# Patient Record
Sex: Male | Born: 1995 | Race: White | Hispanic: No | Marital: Single | Smoking: Never smoker
Health system: Southern US, Community
[De-identification: ages and names within clinical notes are randomized; demographics above are authoritative.]

## PROBLEM LIST (undated history)

## (undated) DIAGNOSIS — F845 Asperger's syndrome: Secondary | ICD-10-CM

## (undated) DIAGNOSIS — F419 Anxiety disorder, unspecified: Secondary | ICD-10-CM

## (undated) DIAGNOSIS — F32A Depression, unspecified: Secondary | ICD-10-CM

## (undated) DIAGNOSIS — F909 Attention-deficit hyperactivity disorder, unspecified type: Secondary | ICD-10-CM

## (undated) DIAGNOSIS — S0083XA Contusion of other part of head, initial encounter: Secondary | ICD-10-CM

## (undated) HISTORY — PX: TONSILLECTOMY: SUR1361

## (undated) HISTORY — DX: Attention-deficit hyperactivity disorder, unspecified type: F90.9

## (undated) HISTORY — PX: WISDOM TOOTH EXTRACTION: SHX21

## (undated) HISTORY — DX: Depression, unspecified: F32.A

## (undated) HISTORY — DX: Asperger's syndrome: F84.5

## (undated) HISTORY — DX: Contusion of other part of head, initial encounter: S00.83XA

---
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
## 2016-03-14 ENCOUNTER — Ambulatory Visit (INDEPENDENT_AMBULATORY_CARE_PROVIDER_SITE_OTHER): Payer: BLUE CROSS/BLUE SHIELD | Admitting: Psychiatry

## 2016-03-14 ENCOUNTER — Encounter (HOSPITAL_COMMUNITY): Payer: Self-pay | Admitting: Psychiatry

## 2016-03-14 VITALS — BP 110/64 | HR 88 | Ht 65.0 in | Wt 133.4 lb

## 2016-03-14 DIAGNOSIS — F902 Attention-deficit hyperactivity disorder, combined type: Secondary | ICD-10-CM

## 2016-03-14 DIAGNOSIS — F845 Asperger's syndrome: Secondary | ICD-10-CM | POA: Diagnosis not present

## 2016-03-14 MED ORDER — DEXTROAMPHETAMINE SULFATE 5 MG PO TABS: 5.0000 mg | ORAL_TABLET | Freq: Two times a day (BID) | ORAL | Status: DC

## 2016-03-14 NOTE — Progress Notes (Signed)
[facility] Follow[facility] Outpatient Visit  Jeffrey Richardson 1996/08/20     Subjective: I'm doing well  History of present illness  Patient seen for medication follow-up. Mom came along with patient's permission. Mom was concerned about the diagnosis of fast Asperger syndrome and wondered when he had been diagnosed with that, going through the charts he had the diagnosis prior to coming to see me this was told to the mother who stated understanding.  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 Sleep is good but lately because of his coughing he is not sleeping well. Appetite has been good mood stable denies feeling hopeless helpless no anxiety no hallucinations or delusions denies suicidal or homicidal ideation. His tolerating his medications well and coping well.                                                                Notes from his initial visit with me on 10/19/15 Patient is a 20 year old African-American male who was seen along with his father with the patient's permission. Patient carries a previous diagnosis of ADHD combined type and Asperger's disorder. 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 Patient discussed rating discontinuing the VyVance 40 mg as he does not feel it helps.Patient states he has had bronchitis and pneumonia as a result of which he missed a lot of school  and had to drop to classes. States his doing better now. Dad reports that his insurance does not cover Vyvanse and so he is looking for an alternative. Discussed the rationale risks benefits options of Dextrostat with the patient and his father and obtained informed consent. Patient will take Dextrostat 5 mg by mouth every a.m. and at known. Discussed with the patient to give it a months try and see how it works he stated understanding.States his sleep is good appetite is good mood is stable denies feeling  hopeless helpless no anhedonia, no suicidal or homicidal ideation no hallucinations or delusions.   Substance abuse history none patient does not smoke cigarettes use alcohol marijuana or other drugs.  He is currently not dating anyone and has never been sexually active.       Active Ambulatory Problems    Diagnosis Date Noted  . ADHD (attention deficit hyperactivity disorder), combined type 06/02/2012  . Asperger syndrome 02/09/2015   Resolved Ambulatory Problems    Diagnosis Date Noted  . No Resolved Ambulatory Problems   Past Medical History  Diagnosis Date  . ADHD (attention deficit hyperactivity disorder)   . Asperger's disorder     Social History   Social History Narrative   patient is adopted   Current outpatient prescriptions:  .  dextroamphetamine (DEXTROSTAT) 5 MG tablet, Take 1 tablet (5 mg total) by mouth 2 (two) times daily with breakfast and lunch., Disp: 60 tablet, Rfl: 0 .  fluticasone (FLONASE) 50 MCG/ACT nasal spray, , Disp: , Rfl: 4 .  levocetirizine (XYZAL) 5 MG tablet, , Disp: , Rfl: 4 .  lisdexamfetamine (VYVANSE) 40 MG capsule, Take 1 capsule (40 mg total) by mouth every morning., Disp: 30 capsule, Rfl: 0 .  lisdexamfetamine (VYVANSE) 40 MG capsule, Take 1 capsule (40  mg total) by mouth every morning., Disp: 30 capsule, Rfl: 0 .  lisdexamfetamine (VYVANSE) 40 MG capsule, Take 1 capsule (40 mg total) by mouth every morning., Disp: 30 capsule, Rfl: 0 .  montelukast (SINGULAIR) 10 MG tablet, , Disp: , Rfl: 4   Review of Systems  Constitutional: Negative.  Negative for fever, weight loss and malaise/fatigue.  HENT: Negative.  Negative for congestion and sore throat.   Eyes: Negative.  Negative for blurred vision and redness.  Respiratory: Negative.  Negative for cough, shortness of breath and wheezing.   Cardiovascular: Negative.  Negative for chest pain and palpitations.  Gastrointestinal: Negative.  Negative for heartburn, nausea, vomiting, abdominal  pain, diarrhea and constipation.  Genitourinary: Negative.  Negative for dysuria.  Musculoskeletal: Negative.  Negative for myalgias and falls.  Skin: Negative.  Negative for rash.  Neurological: Negative.  Negative for dizziness, tremors, seizures, loss of consciousness, weakness and headaches.  Endo/Heme/Allergies: Negative.  Negative for environmental allergies.  Psychiatric/Behavioral: Negative.  Negative for depression, suicidal ideas, hallucinations, memory loss and substance abuse. The patient is not nervous/anxious and does not have insomnia.    General Appearance: alert, oriented, no acute distress and well nourished  Musculoskeletal: Strength & Muscle Tone: within normal limits Gait & Station: normal Patient leans: N/A  Mental Status Examination Appearance: Well groomed and casually dressed Alert: Yes Attention: good  Cooperative: Yes Eye Contact: Good Speech: Clear and coherent Psychomotor Activity: Normal Memory/Concentration: Intact/fair Oriented: person, place, time/date and situation Mood: Euthymic Affect: Appropriate Thought Processes and Associations: Linear Fund of Knowledge: Good Thought Content: Normal Insight: Good  Judgement:Good  Language: good suicidal ideation none  homicidal ideation none  Diagnosis:  ADHD, combined type , Asperger's disorder  Treatment plan: ADHD Continue Dextrostat 5 mg a.m. and noon.  Aspergers  disorder Symptoms will be monitored  Call when necessary Followup in 3  months ,Dr Lolly Mustache. Discussed provider was leaving the clinic and that he could see Dr. are seen deep stated understanding. 50% of this visit was spent in discussing organization,, scheduling things, impulse control techniques, social skills  interpersonal and supportive therapy was provided. Margit Banda, MD

## 2016-04-04 DIAGNOSIS — R05 Cough: Secondary | ICD-10-CM | POA: Diagnosis not present

## 2016-04-10 DIAGNOSIS — J309 Allergic rhinitis, unspecified: Secondary | ICD-10-CM | POA: Diagnosis not present

## 2016-04-10 DIAGNOSIS — R05 Cough: Secondary | ICD-10-CM | POA: Diagnosis not present

## 2016-04-17 DIAGNOSIS — J3089 Other allergic rhinitis: Secondary | ICD-10-CM | POA: Diagnosis not present

## 2016-04-17 DIAGNOSIS — J301 Allergic rhinitis due to pollen: Secondary | ICD-10-CM | POA: Diagnosis not present

## 2016-04-18 DIAGNOSIS — J3089 Other allergic rhinitis: Secondary | ICD-10-CM | POA: Diagnosis not present

## 2016-04-23 DIAGNOSIS — J301 Allergic rhinitis due to pollen: Secondary | ICD-10-CM | POA: Diagnosis not present

## 2016-04-23 DIAGNOSIS — J3089 Other allergic rhinitis: Secondary | ICD-10-CM | POA: Diagnosis not present

## 2016-04-23 DIAGNOSIS — J3081 Allergic rhinitis due to animal (cat) (dog) hair and dander: Secondary | ICD-10-CM | POA: Diagnosis not present

## 2016-04-26 DIAGNOSIS — J3081 Allergic rhinitis due to animal (cat) (dog) hair and dander: Secondary | ICD-10-CM | POA: Diagnosis not present

## 2016-04-26 DIAGNOSIS — J301 Allergic rhinitis due to pollen: Secondary | ICD-10-CM | POA: Diagnosis not present

## 2016-04-26 DIAGNOSIS — J3089 Other allergic rhinitis: Secondary | ICD-10-CM | POA: Diagnosis not present

## 2016-05-15 DIAGNOSIS — J3081 Allergic rhinitis due to animal (cat) (dog) hair and dander: Secondary | ICD-10-CM | POA: Diagnosis not present

## 2016-05-15 DIAGNOSIS — J3089 Other allergic rhinitis: Secondary | ICD-10-CM | POA: Diagnosis not present

## 2016-05-15 DIAGNOSIS — R05 Cough: Secondary | ICD-10-CM | POA: Diagnosis not present

## 2016-05-15 DIAGNOSIS — J301 Allergic rhinitis due to pollen: Secondary | ICD-10-CM | POA: Diagnosis not present

## 2016-05-16 ENCOUNTER — Ambulatory Visit (HOSPITAL_COMMUNITY): Payer: Self-pay | Admitting: Psychiatry

## 2016-05-17 DIAGNOSIS — J3089 Other allergic rhinitis: Secondary | ICD-10-CM | POA: Diagnosis not present

## 2016-05-17 DIAGNOSIS — J301 Allergic rhinitis due to pollen: Secondary | ICD-10-CM | POA: Diagnosis not present

## 2016-05-17 DIAGNOSIS — J3081 Allergic rhinitis due to animal (cat) (dog) hair and dander: Secondary | ICD-10-CM | POA: Diagnosis not present

## 2016-05-21 ENCOUNTER — Encounter (HOSPITAL_COMMUNITY): Payer: Self-pay | Admitting: Psychiatry

## 2016-05-21 ENCOUNTER — Ambulatory Visit (INDEPENDENT_AMBULATORY_CARE_PROVIDER_SITE_OTHER): Payer: BLUE CROSS/BLUE SHIELD | Admitting: Psychiatry

## 2016-05-21 VITALS — BP 110/70 | HR 74 | Ht 65.0 in | Wt 126.8 lb

## 2016-05-21 DIAGNOSIS — F902 Attention-deficit hyperactivity disorder, combined type: Secondary | ICD-10-CM | POA: Diagnosis not present

## 2016-05-21 MED ORDER — DEXTROAMPHETAMINE SULFATE 5 MG PO TABS: 5.0000 mg | ORAL_TABLET | Freq: Two times a day (BID) | ORAL | Status: DC

## 2016-05-21 MED ORDER — DEXTROAMPHETAMINE SULFATE 5 MG PO TABS: 5.0000 mg | ORAL_TABLET | Freq: Two times a day (BID) | ORAL | Status: AC

## 2016-05-21 NOTE — Progress Notes (Signed)
[facility] [address] Progress Note  Jeffrey Richardson [id number] 20 y.o.  05/21/2016 1:50 PM  Chief Complaint:  I have ADD.  I take medicine.  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  He was seeing Dr. Jarold Motto who left the practice.  Earlier he was seeing Dr. Lucianne Muss and now he is 20 years old and need to see a adult psychiatrist.  Patient told he is taking stimulants for more than 10 years which is helping his ADD symptoms.  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  He admitted his grades were not as good because last fall he was having a lot of health issues including bronchitis and pneumonia and he missed a lot of school days.  He is hoping to get transfer his school to [employer].  He is taking stimulant twice a day which is helping his focus, attention, multitasking.  He started summer job at [employer] and sometimes it's very active.  Patient also told his brother moved after 3 years living in group home this March and there has been some friction with him sometimes.  The patient denies any agitation, anger, mood swing.  Patient told his son is diagnosed with autism.  Patient denies any side effects of stimulant.  He does not drink alcohol or use any illegal substances.  He lives with his parents.  He is adopted son and he does not know anything about his biological parents.  He is also scheduled to see his primary care physician and he is going to see him first time since he chooses adult primary care physician.  He recently finished his antibiotic but he is still taking medicine for his seasonal allergies.  He reported his appetite is okay.  His vital signs are stable.  He denies any paranoia, hallucination, anger issues or any feeling of hopelessness or worthlessness.  Suicidal Ideation: No Plan Formed: No Patient has  means to carry out plan: No  Homicidal Ideation: No Plan Formed: No Patient has means to carry out plan: No  Medical History; Patient has seasonal allergies.  He has a history of severe bronchitis and pneumonia.  He is going to see his primary care physician next week.  Patient denies any history of seizures.  Family History; Patient is unaware about his family history.  He was adopted  Programme researcher, broadcasting/film/video and Work History; Patient is studying arts at [employer] and hoping to transfer at [employer].  He is also working summer job at pool  Psychosocial History; Patient lives with his adopted parents and recently his 87 year old younger adopted sibling moved in to live with them.  Patient told his adopted sibling has history of autism and he was living in a group home.  Substance Abuse History; Patient denies any history of drug use or any illegal substance use.  Past Psychiatric History/Hospitalization(s) Patient is seen in this office since 2007.  He had taken Vyvanse which help his ADD but his insurance does not approve.  Patient do not remember if he has psychological testing.  He has seen Dr. Lucianne Muss, Jorje Guild and recently Dr. Jarold Motto.  Patient denies any history of psychiatric inpatient treatment, suicidal attempt, anger issues, paranoia or any hallucination.  He denies any history of self abusive behavior. Anxiety: No Bipolar Disorder: No Depression: No Mania: No Psychosis: No  Schizophrenia: No Personality Disorder: No Hospitalization for psychiatric illness: No History of Electroconvulsive Shock Therapy: No Prior Suicide Attempts: No   Review of Systems: Psychiatric: Agitation: No Hallucination: No Depressed Mood: No Insomnia: No Hypersomnia: No Altered Concentration: No Feels Worthless: No Grandiose Ideas: No Belief In Special Powers: No New/Increased Substance Abuse: No Compulsions: No  Neurologic: Headache: No Seizure: No Paresthesias: No  Outpatient Encounter  Prescriptions as of 05/21/2016  Medication Sig  . dextroamphetamine (DEXTROSTAT) 5 MG tablet Take 1 tablet (5 mg total) by mouth 2 (two) times daily with breakfast and lunch.  . fluticasone (FLONASE) 50 MCG/ACT nasal spray   . levocetirizine (XYZAL) 5 MG tablet   . montelukast (SINGULAIR) 10 MG tablet   . [DISCONTINUED] dextroamphetamine (DEXTROSTAT) 5 MG tablet Take 1 tablet (5 mg total) by mouth 2 (two) times daily with breakfast and lunch.  . [DISCONTINUED] dextroamphetamine (DEXTROSTAT) 5 MG tablet Take 1 tablet (5 mg total) by mouth 2 (two) times daily with breakfast and lunch.  . [DISCONTINUED] dextroamphetamine (DEXTROSTAT) 5 MG tablet Take 1 tablet (5 mg total) by mouth 2 (two) times daily with breakfast and lunch.  . EPINEPHrine 0.3 mg/0.3 mL IJ SOAJ injection   . omeprazole (PRILOSEC) 40 MG capsule   . [DISCONTINUED] lisdexamfetamine (VYVANSE) 40 MG capsule Take 1 capsule (40 mg total) by mouth every morning.  . [DISCONTINUED] lisdexamfetamine (VYVANSE) 40 MG capsule Take 1 capsule (40 mg total) by mouth every morning.  . [DISCONTINUED] lisdexamfetamine (VYVANSE) 40 MG capsule Take 1 capsule (40 mg total) by mouth every morning.   No facility-administered encounter medications on file as of 05/21/2016.    No results found for this or any previous visit (from the past 2160 hour(s)).  Physical Exam: Consitutional ;  BP 110/70 mmHg  Pulse 74  Ht 5\' 5"  (1.651 m)  Wt 126 lb 12.8 oz (57.516 kg)  BMI 21.10 kg/m2  Musculoskeletal: Strength & Muscle Tone: within normal limits Gait & Station: normal Patient leans: N/A   Psychiatric Specialty Exam: General Appearance: Casual  Eye Contact::  Good  Speech:  Clear and Coherent  Volume:  Normal  Mood:  Anxious  Affect:  Appropriate and Congruent  Thought Process:  Goal Directed  Orientation:  Full (Time, Place, and Person)  Thought Content:  WDL and Logical  Suicidal Thoughts:  No  Homicidal Thoughts:  No  Memory:  Immediate;    Good Recent;   Good Remote;   Good  Judgement:  Good  Insight:  Good  Psychomotor Activity:  Normal  Concentration:  Good  Recall:  Good  Fund of Knowledge:  Good  Language:  Good  Akathisia:  No  Handed:  Right  AIMS (if indicated):     Assets:  Communication Skills Desire for Improvement Financial Resources/Insurance Housing Physical Health  ADL's:  Intact  Cognition:  WNL  Sleep:        Established Problem, Stable/Improving (1), Review of Psycho-Social Stressors (1), Review or order clinical lab tests (1), Decision to obtain old records (1), Review and summation of old records (2), Review of Last Therapy Session (1) and Review of Medication Regimen & Side Effects (2)  Assessment: Axis I; ADHD  Axis III:  Past Medical History  Diagnosis Date  . ADHD (attention deficit hyperactivity disorder)   . Asperger's disorder     Plan:  I review his symptoms, collateral information, current medication, psychosocial stressors and previous record.  Patient is a stable on dextroamphetamine 5 mg 1 in the  morning and 1 at noon.  Patient told medicine helping his focus, attention and multitasking.  He does not abuse or ask for early refills.  Discussed medication side effects and benefits in detail.  Patient scheduled to see his primary care physician for his physical and blood work and I recommended to have his blood results faxed to us.  I recommended to call us back if he has any question, concern if he feel worsening of the symptom.  I will see him again in 3 months.  Discuss safety plan that anytime having active suicidal thoughts or homicidal thought that he need to call 911 or go to the local emergency room.  Adil Tugwell T., MD 05/21/2016

## 2016-05-24 DIAGNOSIS — K011 Impacted teeth: Secondary | ICD-10-CM | POA: Diagnosis not present

## 2016-05-28 DIAGNOSIS — J3089 Other allergic rhinitis: Secondary | ICD-10-CM | POA: Diagnosis not present

## 2016-05-28 DIAGNOSIS — J301 Allergic rhinitis due to pollen: Secondary | ICD-10-CM | POA: Diagnosis not present

## 2016-05-28 DIAGNOSIS — J3081 Allergic rhinitis due to animal (cat) (dog) hair and dander: Secondary | ICD-10-CM | POA: Diagnosis not present

## 2016-05-30 DIAGNOSIS — Z0000 Encounter for general adult medical examination without abnormal findings: Secondary | ICD-10-CM | POA: Diagnosis not present

## 2016-06-05 DIAGNOSIS — J3089 Other allergic rhinitis: Secondary | ICD-10-CM | POA: Diagnosis not present

## 2016-06-05 DIAGNOSIS — J301 Allergic rhinitis due to pollen: Secondary | ICD-10-CM | POA: Diagnosis not present

## 2016-06-05 DIAGNOSIS — J3081 Allergic rhinitis due to animal (cat) (dog) hair and dander: Secondary | ICD-10-CM | POA: Diagnosis not present

## 2016-06-17 DIAGNOSIS — J3089 Other allergic rhinitis: Secondary | ICD-10-CM | POA: Diagnosis not present

## 2016-06-17 DIAGNOSIS — J301 Allergic rhinitis due to pollen: Secondary | ICD-10-CM | POA: Diagnosis not present

## 2016-06-17 DIAGNOSIS — J3081 Allergic rhinitis due to animal (cat) (dog) hair and dander: Secondary | ICD-10-CM | POA: Diagnosis not present

## 2016-06-21 DIAGNOSIS — J301 Allergic rhinitis due to pollen: Secondary | ICD-10-CM | POA: Diagnosis not present

## 2016-06-21 DIAGNOSIS — J3081 Allergic rhinitis due to animal (cat) (dog) hair and dander: Secondary | ICD-10-CM | POA: Diagnosis not present

## 2016-06-21 DIAGNOSIS — J3089 Other allergic rhinitis: Secondary | ICD-10-CM | POA: Diagnosis not present

## 2016-06-26 DIAGNOSIS — J3081 Allergic rhinitis due to animal (cat) (dog) hair and dander: Secondary | ICD-10-CM | POA: Diagnosis not present

## 2016-06-26 DIAGNOSIS — J301 Allergic rhinitis due to pollen: Secondary | ICD-10-CM | POA: Diagnosis not present

## 2016-06-26 DIAGNOSIS — J3089 Other allergic rhinitis: Secondary | ICD-10-CM | POA: Diagnosis not present

## 2016-07-05 DIAGNOSIS — J3089 Other allergic rhinitis: Secondary | ICD-10-CM | POA: Diagnosis not present

## 2016-07-05 DIAGNOSIS — J3081 Allergic rhinitis due to animal (cat) (dog) hair and dander: Secondary | ICD-10-CM | POA: Diagnosis not present

## 2016-07-05 DIAGNOSIS — J301 Allergic rhinitis due to pollen: Secondary | ICD-10-CM | POA: Diagnosis not present

## 2016-07-08 DIAGNOSIS — J3089 Other allergic rhinitis: Secondary | ICD-10-CM | POA: Diagnosis not present

## 2016-07-08 DIAGNOSIS — J3081 Allergic rhinitis due to animal (cat) (dog) hair and dander: Secondary | ICD-10-CM | POA: Diagnosis not present

## 2016-07-08 DIAGNOSIS — J301 Allergic rhinitis due to pollen: Secondary | ICD-10-CM | POA: Diagnosis not present

## 2016-07-15 DIAGNOSIS — J3089 Other allergic rhinitis: Secondary | ICD-10-CM | POA: Diagnosis not present

## 2016-07-15 DIAGNOSIS — J301 Allergic rhinitis due to pollen: Secondary | ICD-10-CM | POA: Diagnosis not present

## 2016-07-15 DIAGNOSIS — J3081 Allergic rhinitis due to animal (cat) (dog) hair and dander: Secondary | ICD-10-CM | POA: Diagnosis not present

## 2016-07-16 DIAGNOSIS — F900 Attention-deficit hyperactivity disorder, predominantly inattentive type: Secondary | ICD-10-CM | POA: Diagnosis not present

## 2016-07-16 DIAGNOSIS — F4011 Social phobia, generalized: Secondary | ICD-10-CM | POA: Diagnosis not present

## 2016-07-23 DIAGNOSIS — J301 Allergic rhinitis due to pollen: Secondary | ICD-10-CM | POA: Diagnosis not present

## 2016-07-23 DIAGNOSIS — J3081 Allergic rhinitis due to animal (cat) (dog) hair and dander: Secondary | ICD-10-CM | POA: Diagnosis not present

## 2016-07-23 DIAGNOSIS — J3089 Other allergic rhinitis: Secondary | ICD-10-CM | POA: Diagnosis not present

## 2016-07-29 DIAGNOSIS — J301 Allergic rhinitis due to pollen: Secondary | ICD-10-CM | POA: Diagnosis not present

## 2016-07-29 DIAGNOSIS — J3089 Other allergic rhinitis: Secondary | ICD-10-CM | POA: Diagnosis not present

## 2016-07-29 DIAGNOSIS — J3081 Allergic rhinitis due to animal (cat) (dog) hair and dander: Secondary | ICD-10-CM | POA: Diagnosis not present

## 2016-07-30 DIAGNOSIS — F902 Attention-deficit hyperactivity disorder, combined type: Secondary | ICD-10-CM | POA: Diagnosis not present

## 2016-08-20 DIAGNOSIS — F4011 Social phobia, generalized: Secondary | ICD-10-CM | POA: Diagnosis not present

## 2016-08-20 DIAGNOSIS — F900 Attention-deficit hyperactivity disorder, predominantly inattentive type: Secondary | ICD-10-CM | POA: Diagnosis not present

## 2016-08-21 ENCOUNTER — Ambulatory Visit (HOSPITAL_COMMUNITY): Payer: Self-pay | Admitting: Psychiatry

## 2016-08-27 DIAGNOSIS — K136 Irritative hyperplasia of oral mucosa: Secondary | ICD-10-CM | POA: Diagnosis not present

## 2016-09-05 DIAGNOSIS — K136 Irritative hyperplasia of oral mucosa: Secondary | ICD-10-CM | POA: Diagnosis not present

## 2016-09-09 DIAGNOSIS — J3089 Other allergic rhinitis: Secondary | ICD-10-CM | POA: Diagnosis not present

## 2016-09-09 DIAGNOSIS — J3081 Allergic rhinitis due to animal (cat) (dog) hair and dander: Secondary | ICD-10-CM | POA: Diagnosis not present

## 2016-09-09 DIAGNOSIS — J301 Allergic rhinitis due to pollen: Secondary | ICD-10-CM | POA: Diagnosis not present

## 2016-09-17 DIAGNOSIS — F4011 Social phobia, generalized: Secondary | ICD-10-CM | POA: Diagnosis not present

## 2016-09-17 DIAGNOSIS — F900 Attention-deficit hyperactivity disorder, predominantly inattentive type: Secondary | ICD-10-CM | POA: Diagnosis not present

## 2016-09-18 DIAGNOSIS — J3081 Allergic rhinitis due to animal (cat) (dog) hair and dander: Secondary | ICD-10-CM | POA: Diagnosis not present

## 2016-09-18 DIAGNOSIS — J301 Allergic rhinitis due to pollen: Secondary | ICD-10-CM | POA: Diagnosis not present

## 2016-09-18 DIAGNOSIS — J3089 Other allergic rhinitis: Secondary | ICD-10-CM | POA: Diagnosis not present

## 2016-09-30 DIAGNOSIS — J301 Allergic rhinitis due to pollen: Secondary | ICD-10-CM | POA: Diagnosis not present

## 2016-09-30 DIAGNOSIS — J3089 Other allergic rhinitis: Secondary | ICD-10-CM | POA: Diagnosis not present

## 2016-09-30 DIAGNOSIS — J3081 Allergic rhinitis due to animal (cat) (dog) hair and dander: Secondary | ICD-10-CM | POA: Diagnosis not present

## 2016-10-07 DIAGNOSIS — J3081 Allergic rhinitis due to animal (cat) (dog) hair and dander: Secondary | ICD-10-CM | POA: Diagnosis not present

## 2016-10-07 DIAGNOSIS — J301 Allergic rhinitis due to pollen: Secondary | ICD-10-CM | POA: Diagnosis not present

## 2016-10-07 DIAGNOSIS — J3089 Other allergic rhinitis: Secondary | ICD-10-CM | POA: Diagnosis not present

## 2016-10-14 DIAGNOSIS — J301 Allergic rhinitis due to pollen: Secondary | ICD-10-CM | POA: Diagnosis not present

## 2016-10-14 DIAGNOSIS — J3089 Other allergic rhinitis: Secondary | ICD-10-CM | POA: Diagnosis not present

## 2016-10-15 DIAGNOSIS — F900 Attention-deficit hyperactivity disorder, predominantly inattentive type: Secondary | ICD-10-CM | POA: Diagnosis not present

## 2016-10-15 DIAGNOSIS — F4011 Social phobia, generalized: Secondary | ICD-10-CM | POA: Diagnosis not present

## 2016-10-16 DIAGNOSIS — F909 Attention-deficit hyperactivity disorder, unspecified type: Secondary | ICD-10-CM | POA: Diagnosis not present

## 2016-10-23 DIAGNOSIS — J301 Allergic rhinitis due to pollen: Secondary | ICD-10-CM | POA: Diagnosis not present

## 2016-10-23 DIAGNOSIS — J3081 Allergic rhinitis due to animal (cat) (dog) hair and dander: Secondary | ICD-10-CM | POA: Diagnosis not present

## 2016-10-23 DIAGNOSIS — F909 Attention-deficit hyperactivity disorder, unspecified type: Secondary | ICD-10-CM | POA: Diagnosis not present

## 2016-10-23 DIAGNOSIS — J3089 Other allergic rhinitis: Secondary | ICD-10-CM | POA: Diagnosis not present

## 2016-10-29 DIAGNOSIS — J3081 Allergic rhinitis due to animal (cat) (dog) hair and dander: Secondary | ICD-10-CM | POA: Diagnosis not present

## 2016-10-29 DIAGNOSIS — J301 Allergic rhinitis due to pollen: Secondary | ICD-10-CM | POA: Diagnosis not present

## 2016-10-29 DIAGNOSIS — J3089 Other allergic rhinitis: Secondary | ICD-10-CM | POA: Diagnosis not present

## 2016-10-30 DIAGNOSIS — F909 Attention-deficit hyperactivity disorder, unspecified type: Secondary | ICD-10-CM | POA: Diagnosis not present

## 2016-11-06 DIAGNOSIS — F909 Attention-deficit hyperactivity disorder, unspecified type: Secondary | ICD-10-CM | POA: Diagnosis not present

## 2016-11-13 DIAGNOSIS — J301 Allergic rhinitis due to pollen: Secondary | ICD-10-CM | POA: Diagnosis not present

## 2016-11-13 DIAGNOSIS — J3081 Allergic rhinitis due to animal (cat) (dog) hair and dander: Secondary | ICD-10-CM | POA: Diagnosis not present

## 2016-11-13 DIAGNOSIS — J3089 Other allergic rhinitis: Secondary | ICD-10-CM | POA: Diagnosis not present

## 2016-11-13 DIAGNOSIS — F4011 Social phobia, generalized: Secondary | ICD-10-CM | POA: Diagnosis not present

## 2016-11-13 DIAGNOSIS — F900 Attention-deficit hyperactivity disorder, predominantly inattentive type: Secondary | ICD-10-CM | POA: Diagnosis not present

## 2016-11-20 DIAGNOSIS — J301 Allergic rhinitis due to pollen: Secondary | ICD-10-CM | POA: Diagnosis not present

## 2016-11-20 DIAGNOSIS — F909 Attention-deficit hyperactivity disorder, unspecified type: Secondary | ICD-10-CM | POA: Diagnosis not present

## 2016-11-20 DIAGNOSIS — J3089 Other allergic rhinitis: Secondary | ICD-10-CM | POA: Diagnosis not present

## 2016-11-20 DIAGNOSIS — J3081 Allergic rhinitis due to animal (cat) (dog) hair and dander: Secondary | ICD-10-CM | POA: Diagnosis not present

## 2016-11-27 DIAGNOSIS — F909 Attention-deficit hyperactivity disorder, unspecified type: Secondary | ICD-10-CM | POA: Diagnosis not present

## 2016-12-04 DIAGNOSIS — F909 Attention-deficit hyperactivity disorder, unspecified type: Secondary | ICD-10-CM | POA: Diagnosis not present

## 2016-12-04 DIAGNOSIS — J301 Allergic rhinitis due to pollen: Secondary | ICD-10-CM | POA: Diagnosis not present

## 2016-12-04 DIAGNOSIS — J3081 Allergic rhinitis due to animal (cat) (dog) hair and dander: Secondary | ICD-10-CM | POA: Diagnosis not present

## 2016-12-04 DIAGNOSIS — J3089 Other allergic rhinitis: Secondary | ICD-10-CM | POA: Diagnosis not present

## 2016-12-11 DIAGNOSIS — F909 Attention-deficit hyperactivity disorder, unspecified type: Secondary | ICD-10-CM | POA: Diagnosis not present

## 2016-12-20 DIAGNOSIS — J301 Allergic rhinitis due to pollen: Secondary | ICD-10-CM | POA: Diagnosis not present

## 2016-12-20 DIAGNOSIS — J3089 Other allergic rhinitis: Secondary | ICD-10-CM | POA: Diagnosis not present

## 2016-12-20 DIAGNOSIS — J3081 Allergic rhinitis due to animal (cat) (dog) hair and dander: Secondary | ICD-10-CM | POA: Diagnosis not present

## 2016-12-25 DIAGNOSIS — J3089 Other allergic rhinitis: Secondary | ICD-10-CM | POA: Diagnosis not present

## 2016-12-25 DIAGNOSIS — F909 Attention-deficit hyperactivity disorder, unspecified type: Secondary | ICD-10-CM | POA: Diagnosis not present

## 2016-12-25 DIAGNOSIS — J3081 Allergic rhinitis due to animal (cat) (dog) hair and dander: Secondary | ICD-10-CM | POA: Diagnosis not present

## 2016-12-25 DIAGNOSIS — J301 Allergic rhinitis due to pollen: Secondary | ICD-10-CM | POA: Diagnosis not present

## 2016-12-27 DIAGNOSIS — J301 Allergic rhinitis due to pollen: Secondary | ICD-10-CM | POA: Diagnosis not present

## 2016-12-27 DIAGNOSIS — J3089 Other allergic rhinitis: Secondary | ICD-10-CM | POA: Diagnosis not present

## 2016-12-27 DIAGNOSIS — J3081 Allergic rhinitis due to animal (cat) (dog) hair and dander: Secondary | ICD-10-CM | POA: Diagnosis not present

## 2016-12-31 DIAGNOSIS — J3081 Allergic rhinitis due to animal (cat) (dog) hair and dander: Secondary | ICD-10-CM | POA: Diagnosis not present

## 2016-12-31 DIAGNOSIS — J301 Allergic rhinitis due to pollen: Secondary | ICD-10-CM | POA: Diagnosis not present

## 2016-12-31 DIAGNOSIS — F909 Attention-deficit hyperactivity disorder, unspecified type: Secondary | ICD-10-CM | POA: Diagnosis not present

## 2016-12-31 DIAGNOSIS — J3089 Other allergic rhinitis: Secondary | ICD-10-CM | POA: Diagnosis not present

## 2017-01-02 DIAGNOSIS — J301 Allergic rhinitis due to pollen: Secondary | ICD-10-CM | POA: Diagnosis not present

## 2017-01-02 DIAGNOSIS — J3081 Allergic rhinitis due to animal (cat) (dog) hair and dander: Secondary | ICD-10-CM | POA: Diagnosis not present

## 2017-01-02 DIAGNOSIS — J3089 Other allergic rhinitis: Secondary | ICD-10-CM | POA: Diagnosis not present

## 2017-01-07 DIAGNOSIS — J3081 Allergic rhinitis due to animal (cat) (dog) hair and dander: Secondary | ICD-10-CM | POA: Diagnosis not present

## 2017-01-07 DIAGNOSIS — J3089 Other allergic rhinitis: Secondary | ICD-10-CM | POA: Diagnosis not present

## 2017-01-07 DIAGNOSIS — J301 Allergic rhinitis due to pollen: Secondary | ICD-10-CM | POA: Diagnosis not present

## 2017-01-14 DIAGNOSIS — F4011 Social phobia, generalized: Secondary | ICD-10-CM | POA: Diagnosis not present

## 2017-01-14 DIAGNOSIS — F900 Attention-deficit hyperactivity disorder, predominantly inattentive type: Secondary | ICD-10-CM | POA: Diagnosis not present

## 2017-01-14 DIAGNOSIS — J301 Allergic rhinitis due to pollen: Secondary | ICD-10-CM | POA: Diagnosis not present

## 2017-01-14 DIAGNOSIS — J3089 Other allergic rhinitis: Secondary | ICD-10-CM | POA: Diagnosis not present

## 2017-01-14 DIAGNOSIS — J3081 Allergic rhinitis due to animal (cat) (dog) hair and dander: Secondary | ICD-10-CM | POA: Diagnosis not present

## 2017-01-16 DIAGNOSIS — J3089 Other allergic rhinitis: Secondary | ICD-10-CM | POA: Diagnosis not present

## 2017-01-16 DIAGNOSIS — J301 Allergic rhinitis due to pollen: Secondary | ICD-10-CM | POA: Diagnosis not present

## 2017-01-16 DIAGNOSIS — J3081 Allergic rhinitis due to animal (cat) (dog) hair and dander: Secondary | ICD-10-CM | POA: Diagnosis not present

## 2017-01-21 DIAGNOSIS — J3081 Allergic rhinitis due to animal (cat) (dog) hair and dander: Secondary | ICD-10-CM | POA: Diagnosis not present

## 2017-01-21 DIAGNOSIS — F909 Attention-deficit hyperactivity disorder, unspecified type: Secondary | ICD-10-CM | POA: Diagnosis not present

## 2017-01-21 DIAGNOSIS — J3089 Other allergic rhinitis: Secondary | ICD-10-CM | POA: Diagnosis not present

## 2017-01-21 DIAGNOSIS — J301 Allergic rhinitis due to pollen: Secondary | ICD-10-CM | POA: Diagnosis not present

## 2017-01-23 DIAGNOSIS — J3081 Allergic rhinitis due to animal (cat) (dog) hair and dander: Secondary | ICD-10-CM | POA: Diagnosis not present

## 2017-01-23 DIAGNOSIS — J3089 Other allergic rhinitis: Secondary | ICD-10-CM | POA: Diagnosis not present

## 2017-01-23 DIAGNOSIS — J301 Allergic rhinitis due to pollen: Secondary | ICD-10-CM | POA: Diagnosis not present

## 2017-01-28 DIAGNOSIS — J301 Allergic rhinitis due to pollen: Secondary | ICD-10-CM | POA: Diagnosis not present

## 2017-01-28 DIAGNOSIS — J3081 Allergic rhinitis due to animal (cat) (dog) hair and dander: Secondary | ICD-10-CM | POA: Diagnosis not present

## 2017-01-28 DIAGNOSIS — J3089 Other allergic rhinitis: Secondary | ICD-10-CM | POA: Diagnosis not present

## 2017-02-04 DIAGNOSIS — F909 Attention-deficit hyperactivity disorder, unspecified type: Secondary | ICD-10-CM | POA: Diagnosis not present

## 2017-02-04 DIAGNOSIS — J3089 Other allergic rhinitis: Secondary | ICD-10-CM | POA: Diagnosis not present

## 2017-02-04 DIAGNOSIS — J3081 Allergic rhinitis due to animal (cat) (dog) hair and dander: Secondary | ICD-10-CM | POA: Diagnosis not present

## 2017-02-04 DIAGNOSIS — J301 Allergic rhinitis due to pollen: Secondary | ICD-10-CM | POA: Diagnosis not present

## 2017-02-06 DIAGNOSIS — J3081 Allergic rhinitis due to animal (cat) (dog) hair and dander: Secondary | ICD-10-CM | POA: Diagnosis not present

## 2017-02-06 DIAGNOSIS — J301 Allergic rhinitis due to pollen: Secondary | ICD-10-CM | POA: Diagnosis not present

## 2017-02-06 DIAGNOSIS — J3089 Other allergic rhinitis: Secondary | ICD-10-CM | POA: Diagnosis not present

## 2017-02-11 DIAGNOSIS — F909 Attention-deficit hyperactivity disorder, unspecified type: Secondary | ICD-10-CM | POA: Diagnosis not present

## 2017-02-11 DIAGNOSIS — J3089 Other allergic rhinitis: Secondary | ICD-10-CM | POA: Diagnosis not present

## 2017-02-11 DIAGNOSIS — J301 Allergic rhinitis due to pollen: Secondary | ICD-10-CM | POA: Diagnosis not present

## 2017-02-11 DIAGNOSIS — J3081 Allergic rhinitis due to animal (cat) (dog) hair and dander: Secondary | ICD-10-CM | POA: Diagnosis not present

## 2017-02-18 DIAGNOSIS — B9789 Other viral agents as the cause of diseases classified elsewhere: Secondary | ICD-10-CM | POA: Diagnosis not present

## 2017-02-18 DIAGNOSIS — J069 Acute upper respiratory infection, unspecified: Secondary | ICD-10-CM | POA: Diagnosis not present

## 2017-02-18 DIAGNOSIS — F909 Attention-deficit hyperactivity disorder, unspecified type: Secondary | ICD-10-CM | POA: Diagnosis not present

## 2017-02-21 DIAGNOSIS — J3081 Allergic rhinitis due to animal (cat) (dog) hair and dander: Secondary | ICD-10-CM | POA: Diagnosis not present

## 2017-02-21 DIAGNOSIS — J3089 Other allergic rhinitis: Secondary | ICD-10-CM | POA: Diagnosis not present

## 2017-02-21 DIAGNOSIS — J301 Allergic rhinitis due to pollen: Secondary | ICD-10-CM | POA: Diagnosis not present

## 2017-02-25 DIAGNOSIS — F909 Attention-deficit hyperactivity disorder, unspecified type: Secondary | ICD-10-CM | POA: Diagnosis not present

## 2017-02-25 DIAGNOSIS — J301 Allergic rhinitis due to pollen: Secondary | ICD-10-CM | POA: Diagnosis not present

## 2017-02-25 DIAGNOSIS — J3081 Allergic rhinitis due to animal (cat) (dog) hair and dander: Secondary | ICD-10-CM | POA: Diagnosis not present

## 2017-02-25 DIAGNOSIS — J3089 Other allergic rhinitis: Secondary | ICD-10-CM | POA: Diagnosis not present

## 2017-02-26 DIAGNOSIS — J3089 Other allergic rhinitis: Secondary | ICD-10-CM | POA: Diagnosis not present

## 2017-03-04 DIAGNOSIS — J3081 Allergic rhinitis due to animal (cat) (dog) hair and dander: Secondary | ICD-10-CM | POA: Diagnosis not present

## 2017-03-04 DIAGNOSIS — F909 Attention-deficit hyperactivity disorder, unspecified type: Secondary | ICD-10-CM | POA: Diagnosis not present

## 2017-03-04 DIAGNOSIS — J3089 Other allergic rhinitis: Secondary | ICD-10-CM | POA: Diagnosis not present

## 2017-03-04 DIAGNOSIS — J301 Allergic rhinitis due to pollen: Secondary | ICD-10-CM | POA: Diagnosis not present

## 2017-03-04 DIAGNOSIS — R05 Cough: Secondary | ICD-10-CM | POA: Diagnosis not present

## 2017-03-06 DIAGNOSIS — F900 Attention-deficit hyperactivity disorder, predominantly inattentive type: Secondary | ICD-10-CM | POA: Diagnosis not present

## 2017-03-06 DIAGNOSIS — F4011 Social phobia, generalized: Secondary | ICD-10-CM | POA: Diagnosis not present

## 2017-03-11 DIAGNOSIS — J3089 Other allergic rhinitis: Secondary | ICD-10-CM | POA: Diagnosis not present

## 2017-03-11 DIAGNOSIS — J3081 Allergic rhinitis due to animal (cat) (dog) hair and dander: Secondary | ICD-10-CM | POA: Diagnosis not present

## 2017-03-11 DIAGNOSIS — J301 Allergic rhinitis due to pollen: Secondary | ICD-10-CM | POA: Diagnosis not present

## 2017-03-11 DIAGNOSIS — F909 Attention-deficit hyperactivity disorder, unspecified type: Secondary | ICD-10-CM | POA: Diagnosis not present

## 2017-03-18 DIAGNOSIS — F909 Attention-deficit hyperactivity disorder, unspecified type: Secondary | ICD-10-CM | POA: Diagnosis not present

## 2017-03-18 DIAGNOSIS — J3089 Other allergic rhinitis: Secondary | ICD-10-CM | POA: Diagnosis not present

## 2017-03-18 DIAGNOSIS — J301 Allergic rhinitis due to pollen: Secondary | ICD-10-CM | POA: Diagnosis not present

## 2017-03-18 DIAGNOSIS — J3081 Allergic rhinitis due to animal (cat) (dog) hair and dander: Secondary | ICD-10-CM | POA: Diagnosis not present

## 2017-03-25 DIAGNOSIS — F909 Attention-deficit hyperactivity disorder, unspecified type: Secondary | ICD-10-CM | POA: Diagnosis not present

## 2017-04-01 DIAGNOSIS — J3089 Other allergic rhinitis: Secondary | ICD-10-CM | POA: Diagnosis not present

## 2017-04-01 DIAGNOSIS — J301 Allergic rhinitis due to pollen: Secondary | ICD-10-CM | POA: Diagnosis not present

## 2017-04-01 DIAGNOSIS — F909 Attention-deficit hyperactivity disorder, unspecified type: Secondary | ICD-10-CM | POA: Diagnosis not present

## 2017-04-01 DIAGNOSIS — J3081 Allergic rhinitis due to animal (cat) (dog) hair and dander: Secondary | ICD-10-CM | POA: Diagnosis not present

## 2017-04-05 DIAGNOSIS — F419 Anxiety disorder, unspecified: Secondary | ICD-10-CM | POA: Diagnosis not present

## 2017-04-05 DIAGNOSIS — R064 Hyperventilation: Secondary | ICD-10-CM | POA: Diagnosis not present

## 2017-04-05 DIAGNOSIS — Z79899 Other long term (current) drug therapy: Secondary | ICD-10-CM | POA: Diagnosis not present

## 2017-04-05 DIAGNOSIS — F410 Panic disorder [episodic paroxysmal anxiety] without agoraphobia: Secondary | ICD-10-CM | POA: Diagnosis not present

## 2017-04-05 DIAGNOSIS — R0602 Shortness of breath: Secondary | ICD-10-CM | POA: Diagnosis not present

## 2017-04-08 DIAGNOSIS — F909 Attention-deficit hyperactivity disorder, unspecified type: Secondary | ICD-10-CM | POA: Diagnosis not present

## 2017-04-15 DIAGNOSIS — J3089 Other allergic rhinitis: Secondary | ICD-10-CM | POA: Diagnosis not present

## 2017-04-15 DIAGNOSIS — J3081 Allergic rhinitis due to animal (cat) (dog) hair and dander: Secondary | ICD-10-CM | POA: Diagnosis not present

## 2017-04-15 DIAGNOSIS — F840 Autistic disorder: Secondary | ICD-10-CM | POA: Diagnosis not present

## 2017-04-15 DIAGNOSIS — J301 Allergic rhinitis due to pollen: Secondary | ICD-10-CM | POA: Diagnosis not present

## 2017-04-22 DIAGNOSIS — J301 Allergic rhinitis due to pollen: Secondary | ICD-10-CM | POA: Diagnosis not present

## 2017-04-22 DIAGNOSIS — J3081 Allergic rhinitis due to animal (cat) (dog) hair and dander: Secondary | ICD-10-CM | POA: Diagnosis not present

## 2017-04-22 DIAGNOSIS — J3089 Other allergic rhinitis: Secondary | ICD-10-CM | POA: Diagnosis not present

## 2017-04-22 DIAGNOSIS — F909 Attention-deficit hyperactivity disorder, unspecified type: Secondary | ICD-10-CM | POA: Diagnosis not present

## 2017-04-29 DIAGNOSIS — F909 Attention-deficit hyperactivity disorder, unspecified type: Secondary | ICD-10-CM | POA: Diagnosis not present

## 2017-05-01 DIAGNOSIS — F900 Attention-deficit hyperactivity disorder, predominantly inattentive type: Secondary | ICD-10-CM | POA: Diagnosis not present

## 2017-05-01 DIAGNOSIS — F4011 Social phobia, generalized: Secondary | ICD-10-CM | POA: Diagnosis not present

## 2017-05-01 DIAGNOSIS — J3081 Allergic rhinitis due to animal (cat) (dog) hair and dander: Secondary | ICD-10-CM | POA: Diagnosis not present

## 2017-05-01 DIAGNOSIS — J301 Allergic rhinitis due to pollen: Secondary | ICD-10-CM | POA: Diagnosis not present

## 2017-05-01 DIAGNOSIS — J3089 Other allergic rhinitis: Secondary | ICD-10-CM | POA: Diagnosis not present

## 2017-05-06 DIAGNOSIS — F909 Attention-deficit hyperactivity disorder, unspecified type: Secondary | ICD-10-CM | POA: Diagnosis not present

## 2017-05-08 DIAGNOSIS — J3081 Allergic rhinitis due to animal (cat) (dog) hair and dander: Secondary | ICD-10-CM | POA: Diagnosis not present

## 2017-05-08 DIAGNOSIS — J301 Allergic rhinitis due to pollen: Secondary | ICD-10-CM | POA: Diagnosis not present

## 2017-05-08 DIAGNOSIS — J3089 Other allergic rhinitis: Secondary | ICD-10-CM | POA: Diagnosis not present

## 2017-05-13 DIAGNOSIS — F909 Attention-deficit hyperactivity disorder, unspecified type: Secondary | ICD-10-CM | POA: Diagnosis not present

## 2017-05-20 DIAGNOSIS — F909 Attention-deficit hyperactivity disorder, unspecified type: Secondary | ICD-10-CM | POA: Diagnosis not present

## 2017-06-03 DIAGNOSIS — J3081 Allergic rhinitis due to animal (cat) (dog) hair and dander: Secondary | ICD-10-CM | POA: Diagnosis not present

## 2017-06-03 DIAGNOSIS — J301 Allergic rhinitis due to pollen: Secondary | ICD-10-CM | POA: Diagnosis not present

## 2017-06-03 DIAGNOSIS — F909 Attention-deficit hyperactivity disorder, unspecified type: Secondary | ICD-10-CM | POA: Diagnosis not present

## 2017-06-03 DIAGNOSIS — J3089 Other allergic rhinitis: Secondary | ICD-10-CM | POA: Diagnosis not present

## 2017-06-17 DIAGNOSIS — F909 Attention-deficit hyperactivity disorder, unspecified type: Secondary | ICD-10-CM | POA: Diagnosis not present

## 2017-06-30 DIAGNOSIS — F900 Attention-deficit hyperactivity disorder, predominantly inattentive type: Secondary | ICD-10-CM | POA: Diagnosis not present

## 2017-06-30 DIAGNOSIS — F4011 Social phobia, generalized: Secondary | ICD-10-CM | POA: Diagnosis not present

## 2017-07-04 DIAGNOSIS — J3081 Allergic rhinitis due to animal (cat) (dog) hair and dander: Secondary | ICD-10-CM | POA: Diagnosis not present

## 2017-07-04 DIAGNOSIS — J301 Allergic rhinitis due to pollen: Secondary | ICD-10-CM | POA: Diagnosis not present

## 2017-07-04 DIAGNOSIS — J3089 Other allergic rhinitis: Secondary | ICD-10-CM | POA: Diagnosis not present

## 2017-07-08 DIAGNOSIS — F909 Attention-deficit hyperactivity disorder, unspecified type: Secondary | ICD-10-CM | POA: Diagnosis not present

## 2017-07-08 DIAGNOSIS — J3081 Allergic rhinitis due to animal (cat) (dog) hair and dander: Secondary | ICD-10-CM | POA: Diagnosis not present

## 2017-07-08 DIAGNOSIS — J3089 Other allergic rhinitis: Secondary | ICD-10-CM | POA: Diagnosis not present

## 2017-07-08 DIAGNOSIS — J301 Allergic rhinitis due to pollen: Secondary | ICD-10-CM | POA: Diagnosis not present

## 2017-07-17 DIAGNOSIS — F909 Attention-deficit hyperactivity disorder, unspecified type: Secondary | ICD-10-CM | POA: Diagnosis not present

## 2017-07-22 DIAGNOSIS — F909 Attention-deficit hyperactivity disorder, unspecified type: Secondary | ICD-10-CM | POA: Diagnosis not present

## 2017-08-01 DIAGNOSIS — J301 Allergic rhinitis due to pollen: Secondary | ICD-10-CM | POA: Diagnosis not present

## 2017-08-01 DIAGNOSIS — J3089 Other allergic rhinitis: Secondary | ICD-10-CM | POA: Diagnosis not present

## 2017-08-01 DIAGNOSIS — J3081 Allergic rhinitis due to animal (cat) (dog) hair and dander: Secondary | ICD-10-CM | POA: Diagnosis not present

## 2017-08-05 DIAGNOSIS — J301 Allergic rhinitis due to pollen: Secondary | ICD-10-CM | POA: Diagnosis not present

## 2017-08-05 DIAGNOSIS — J3089 Other allergic rhinitis: Secondary | ICD-10-CM | POA: Diagnosis not present

## 2017-08-05 DIAGNOSIS — F909 Attention-deficit hyperactivity disorder, unspecified type: Secondary | ICD-10-CM | POA: Diagnosis not present

## 2017-08-05 DIAGNOSIS — J3081 Allergic rhinitis due to animal (cat) (dog) hair and dander: Secondary | ICD-10-CM | POA: Diagnosis not present

## 2017-08-12 DIAGNOSIS — J301 Allergic rhinitis due to pollen: Secondary | ICD-10-CM | POA: Diagnosis not present

## 2017-08-12 DIAGNOSIS — J3081 Allergic rhinitis due to animal (cat) (dog) hair and dander: Secondary | ICD-10-CM | POA: Diagnosis not present

## 2017-08-12 DIAGNOSIS — F909 Attention-deficit hyperactivity disorder, unspecified type: Secondary | ICD-10-CM | POA: Diagnosis not present

## 2017-08-12 DIAGNOSIS — J3089 Other allergic rhinitis: Secondary | ICD-10-CM | POA: Diagnosis not present

## 2017-08-19 DIAGNOSIS — F909 Attention-deficit hyperactivity disorder, unspecified type: Secondary | ICD-10-CM | POA: Diagnosis not present

## 2017-08-26 DIAGNOSIS — F4011 Social phobia, generalized: Secondary | ICD-10-CM | POA: Diagnosis not present

## 2017-08-26 DIAGNOSIS — F909 Attention-deficit hyperactivity disorder, unspecified type: Secondary | ICD-10-CM | POA: Diagnosis not present

## 2017-08-26 DIAGNOSIS — F900 Attention-deficit hyperactivity disorder, predominantly inattentive type: Secondary | ICD-10-CM | POA: Diagnosis not present

## 2017-09-02 DIAGNOSIS — F909 Attention-deficit hyperactivity disorder, unspecified type: Secondary | ICD-10-CM | POA: Diagnosis not present

## 2017-09-09 DIAGNOSIS — F909 Attention-deficit hyperactivity disorder, unspecified type: Secondary | ICD-10-CM | POA: Diagnosis not present

## 2017-09-16 DIAGNOSIS — F902 Attention-deficit hyperactivity disorder, combined type: Secondary | ICD-10-CM | POA: Diagnosis not present

## 2017-09-16 DIAGNOSIS — F909 Attention-deficit hyperactivity disorder, unspecified type: Secondary | ICD-10-CM | POA: Diagnosis not present

## 2017-09-17 DIAGNOSIS — J3089 Other allergic rhinitis: Secondary | ICD-10-CM | POA: Diagnosis not present

## 2017-09-17 DIAGNOSIS — J3081 Allergic rhinitis due to animal (cat) (dog) hair and dander: Secondary | ICD-10-CM | POA: Diagnosis not present

## 2017-09-17 DIAGNOSIS — J301 Allergic rhinitis due to pollen: Secondary | ICD-10-CM | POA: Diagnosis not present

## 2017-09-23 DIAGNOSIS — F909 Attention-deficit hyperactivity disorder, unspecified type: Secondary | ICD-10-CM | POA: Diagnosis not present

## 2017-10-06 DIAGNOSIS — F909 Attention-deficit hyperactivity disorder, unspecified type: Secondary | ICD-10-CM | POA: Diagnosis not present

## 2017-10-15 DIAGNOSIS — F909 Attention-deficit hyperactivity disorder, unspecified type: Secondary | ICD-10-CM | POA: Diagnosis not present

## 2017-10-20 DIAGNOSIS — F909 Attention-deficit hyperactivity disorder, unspecified type: Secondary | ICD-10-CM | POA: Diagnosis not present

## 2017-10-27 DIAGNOSIS — F909 Attention-deficit hyperactivity disorder, unspecified type: Secondary | ICD-10-CM | POA: Diagnosis not present

## 2017-11-04 DIAGNOSIS — F909 Attention-deficit hyperactivity disorder, unspecified type: Secondary | ICD-10-CM | POA: Diagnosis not present

## 2017-11-10 DIAGNOSIS — F909 Attention-deficit hyperactivity disorder, unspecified type: Secondary | ICD-10-CM | POA: Diagnosis not present

## 2017-11-19 DIAGNOSIS — F900 Attention-deficit hyperactivity disorder, predominantly inattentive type: Secondary | ICD-10-CM | POA: Diagnosis not present

## 2017-11-19 DIAGNOSIS — F4011 Social phobia, generalized: Secondary | ICD-10-CM | POA: Diagnosis not present

## 2017-11-19 DIAGNOSIS — F909 Attention-deficit hyperactivity disorder, unspecified type: Secondary | ICD-10-CM | POA: Diagnosis not present

## 2017-11-24 DIAGNOSIS — F909 Attention-deficit hyperactivity disorder, unspecified type: Secondary | ICD-10-CM | POA: Diagnosis not present

## 2017-12-08 DIAGNOSIS — F909 Attention-deficit hyperactivity disorder, unspecified type: Secondary | ICD-10-CM | POA: Diagnosis not present

## 2017-12-29 DIAGNOSIS — F909 Attention-deficit hyperactivity disorder, unspecified type: Secondary | ICD-10-CM | POA: Diagnosis not present

## 2018-01-05 DIAGNOSIS — F909 Attention-deficit hyperactivity disorder, unspecified type: Secondary | ICD-10-CM | POA: Diagnosis not present

## 2018-01-12 DIAGNOSIS — F909 Attention-deficit hyperactivity disorder, unspecified type: Secondary | ICD-10-CM | POA: Diagnosis not present

## 2018-01-16 DIAGNOSIS — F4011 Social phobia, generalized: Secondary | ICD-10-CM | POA: Diagnosis not present

## 2018-01-16 DIAGNOSIS — F900 Attention-deficit hyperactivity disorder, predominantly inattentive type: Secondary | ICD-10-CM | POA: Diagnosis not present

## 2018-01-16 DIAGNOSIS — J301 Allergic rhinitis due to pollen: Secondary | ICD-10-CM | POA: Diagnosis not present

## 2018-01-16 DIAGNOSIS — J3081 Allergic rhinitis due to animal (cat) (dog) hair and dander: Secondary | ICD-10-CM | POA: Diagnosis not present

## 2018-01-19 DIAGNOSIS — J3089 Other allergic rhinitis: Secondary | ICD-10-CM | POA: Diagnosis not present

## 2018-01-19 DIAGNOSIS — F909 Attention-deficit hyperactivity disorder, unspecified type: Secondary | ICD-10-CM | POA: Diagnosis not present

## 2018-01-26 DIAGNOSIS — J3081 Allergic rhinitis due to animal (cat) (dog) hair and dander: Secondary | ICD-10-CM | POA: Diagnosis not present

## 2018-01-26 DIAGNOSIS — J301 Allergic rhinitis due to pollen: Secondary | ICD-10-CM | POA: Diagnosis not present

## 2018-01-26 DIAGNOSIS — J3089 Other allergic rhinitis: Secondary | ICD-10-CM | POA: Diagnosis not present

## 2018-01-30 DIAGNOSIS — J3089 Other allergic rhinitis: Secondary | ICD-10-CM | POA: Diagnosis not present

## 2018-01-30 DIAGNOSIS — J3081 Allergic rhinitis due to animal (cat) (dog) hair and dander: Secondary | ICD-10-CM | POA: Diagnosis not present

## 2018-01-30 DIAGNOSIS — J301 Allergic rhinitis due to pollen: Secondary | ICD-10-CM | POA: Diagnosis not present

## 2018-02-02 DIAGNOSIS — F909 Attention-deficit hyperactivity disorder, unspecified type: Secondary | ICD-10-CM | POA: Diagnosis not present

## 2018-02-03 DIAGNOSIS — J3081 Allergic rhinitis due to animal (cat) (dog) hair and dander: Secondary | ICD-10-CM | POA: Diagnosis not present

## 2018-02-03 DIAGNOSIS — J301 Allergic rhinitis due to pollen: Secondary | ICD-10-CM | POA: Diagnosis not present

## 2018-02-03 DIAGNOSIS — J3089 Other allergic rhinitis: Secondary | ICD-10-CM | POA: Diagnosis not present

## 2018-02-09 DIAGNOSIS — F909 Attention-deficit hyperactivity disorder, unspecified type: Secondary | ICD-10-CM | POA: Diagnosis not present

## 2018-02-20 DIAGNOSIS — J3081 Allergic rhinitis due to animal (cat) (dog) hair and dander: Secondary | ICD-10-CM | POA: Diagnosis not present

## 2018-02-20 DIAGNOSIS — J3089 Other allergic rhinitis: Secondary | ICD-10-CM | POA: Diagnosis not present

## 2018-02-20 DIAGNOSIS — J301 Allergic rhinitis due to pollen: Secondary | ICD-10-CM | POA: Diagnosis not present

## 2018-02-23 DIAGNOSIS — F909 Attention-deficit hyperactivity disorder, unspecified type: Secondary | ICD-10-CM | POA: Diagnosis not present

## 2018-03-02 DIAGNOSIS — F909 Attention-deficit hyperactivity disorder, unspecified type: Secondary | ICD-10-CM | POA: Diagnosis not present

## 2018-03-04 DIAGNOSIS — R05 Cough: Secondary | ICD-10-CM | POA: Diagnosis not present

## 2018-03-04 DIAGNOSIS — J3089 Other allergic rhinitis: Secondary | ICD-10-CM | POA: Diagnosis not present

## 2018-03-04 DIAGNOSIS — J3081 Allergic rhinitis due to animal (cat) (dog) hair and dander: Secondary | ICD-10-CM | POA: Diagnosis not present

## 2018-03-04 DIAGNOSIS — J301 Allergic rhinitis due to pollen: Secondary | ICD-10-CM | POA: Diagnosis not present

## 2018-03-10 DIAGNOSIS — F909 Attention-deficit hyperactivity disorder, unspecified type: Secondary | ICD-10-CM | POA: Diagnosis not present

## 2018-03-11 DIAGNOSIS — F900 Attention-deficit hyperactivity disorder, predominantly inattentive type: Secondary | ICD-10-CM | POA: Diagnosis not present

## 2018-03-11 DIAGNOSIS — F4011 Social phobia, generalized: Secondary | ICD-10-CM | POA: Diagnosis not present

## 2018-03-16 DIAGNOSIS — F909 Attention-deficit hyperactivity disorder, unspecified type: Secondary | ICD-10-CM | POA: Diagnosis not present

## 2018-03-23 DIAGNOSIS — F909 Attention-deficit hyperactivity disorder, unspecified type: Secondary | ICD-10-CM | POA: Diagnosis not present

## 2018-04-06 DIAGNOSIS — F909 Attention-deficit hyperactivity disorder, unspecified type: Secondary | ICD-10-CM | POA: Diagnosis not present

## 2018-04-20 DIAGNOSIS — F909 Attention-deficit hyperactivity disorder, unspecified type: Secondary | ICD-10-CM | POA: Diagnosis not present

## 2018-04-29 DIAGNOSIS — J3081 Allergic rhinitis due to animal (cat) (dog) hair and dander: Secondary | ICD-10-CM | POA: Diagnosis not present

## 2018-04-29 DIAGNOSIS — J3089 Other allergic rhinitis: Secondary | ICD-10-CM | POA: Diagnosis not present

## 2018-04-29 DIAGNOSIS — J301 Allergic rhinitis due to pollen: Secondary | ICD-10-CM | POA: Diagnosis not present

## 2018-05-04 DIAGNOSIS — F909 Attention-deficit hyperactivity disorder, unspecified type: Secondary | ICD-10-CM | POA: Diagnosis not present

## 2018-05-06 DIAGNOSIS — F900 Attention-deficit hyperactivity disorder, predominantly inattentive type: Secondary | ICD-10-CM | POA: Diagnosis not present

## 2018-05-06 DIAGNOSIS — F4011 Social phobia, generalized: Secondary | ICD-10-CM | POA: Diagnosis not present

## 2018-05-15 DIAGNOSIS — F909 Attention-deficit hyperactivity disorder, unspecified type: Secondary | ICD-10-CM | POA: Diagnosis not present

## 2018-06-01 DIAGNOSIS — F909 Attention-deficit hyperactivity disorder, unspecified type: Secondary | ICD-10-CM | POA: Diagnosis not present

## 2018-06-03 DIAGNOSIS — J301 Allergic rhinitis due to pollen: Secondary | ICD-10-CM | POA: Diagnosis not present

## 2018-06-03 DIAGNOSIS — J3089 Other allergic rhinitis: Secondary | ICD-10-CM | POA: Diagnosis not present

## 2018-06-03 DIAGNOSIS — J3081 Allergic rhinitis due to animal (cat) (dog) hair and dander: Secondary | ICD-10-CM | POA: Diagnosis not present

## 2018-06-10 DIAGNOSIS — J301 Allergic rhinitis due to pollen: Secondary | ICD-10-CM | POA: Diagnosis not present

## 2018-06-10 DIAGNOSIS — J3089 Other allergic rhinitis: Secondary | ICD-10-CM | POA: Diagnosis not present

## 2018-06-10 DIAGNOSIS — J3081 Allergic rhinitis due to animal (cat) (dog) hair and dander: Secondary | ICD-10-CM | POA: Diagnosis not present

## 2018-06-15 DIAGNOSIS — F909 Attention-deficit hyperactivity disorder, unspecified type: Secondary | ICD-10-CM | POA: Diagnosis not present

## 2018-06-29 DIAGNOSIS — F900 Attention-deficit hyperactivity disorder, predominantly inattentive type: Secondary | ICD-10-CM | POA: Diagnosis not present

## 2018-06-29 DIAGNOSIS — F909 Attention-deficit hyperactivity disorder, unspecified type: Secondary | ICD-10-CM | POA: Diagnosis not present

## 2018-07-02 DIAGNOSIS — F4011 Social phobia, generalized: Secondary | ICD-10-CM | POA: Diagnosis not present

## 2018-07-02 DIAGNOSIS — F900 Attention-deficit hyperactivity disorder, predominantly inattentive type: Secondary | ICD-10-CM | POA: Diagnosis not present

## 2018-07-20 DIAGNOSIS — F909 Attention-deficit hyperactivity disorder, unspecified type: Secondary | ICD-10-CM | POA: Diagnosis not present

## 2018-07-22 DIAGNOSIS — J301 Allergic rhinitis due to pollen: Secondary | ICD-10-CM | POA: Diagnosis not present

## 2018-07-22 DIAGNOSIS — J3081 Allergic rhinitis due to animal (cat) (dog) hair and dander: Secondary | ICD-10-CM | POA: Diagnosis not present

## 2018-07-22 DIAGNOSIS — J3089 Other allergic rhinitis: Secondary | ICD-10-CM | POA: Diagnosis not present

## 2018-07-29 DIAGNOSIS — J3089 Other allergic rhinitis: Secondary | ICD-10-CM | POA: Diagnosis not present

## 2018-07-29 DIAGNOSIS — J301 Allergic rhinitis due to pollen: Secondary | ICD-10-CM | POA: Diagnosis not present

## 2018-07-29 DIAGNOSIS — J3081 Allergic rhinitis due to animal (cat) (dog) hair and dander: Secondary | ICD-10-CM | POA: Diagnosis not present

## 2018-08-03 DIAGNOSIS — F909 Attention-deficit hyperactivity disorder, unspecified type: Secondary | ICD-10-CM | POA: Diagnosis not present

## 2018-08-17 DIAGNOSIS — F909 Attention-deficit hyperactivity disorder, unspecified type: Secondary | ICD-10-CM | POA: Diagnosis not present

## 2018-08-31 DIAGNOSIS — F909 Attention-deficit hyperactivity disorder, unspecified type: Secondary | ICD-10-CM | POA: Diagnosis not present

## 2018-09-10 DIAGNOSIS — F900 Attention-deficit hyperactivity disorder, predominantly inattentive type: Secondary | ICD-10-CM | POA: Diagnosis not present

## 2018-09-10 DIAGNOSIS — F4011 Social phobia, generalized: Secondary | ICD-10-CM | POA: Diagnosis not present

## 2018-09-14 DIAGNOSIS — F909 Attention-deficit hyperactivity disorder, unspecified type: Secondary | ICD-10-CM | POA: Diagnosis not present

## 2018-09-28 DIAGNOSIS — F909 Attention-deficit hyperactivity disorder, unspecified type: Secondary | ICD-10-CM | POA: Diagnosis not present

## 2018-10-12 DIAGNOSIS — F909 Attention-deficit hyperactivity disorder, unspecified type: Secondary | ICD-10-CM | POA: Diagnosis not present

## 2018-10-26 DIAGNOSIS — F909 Attention-deficit hyperactivity disorder, unspecified type: Secondary | ICD-10-CM | POA: Diagnosis not present

## 2018-10-26 DIAGNOSIS — Z1389 Encounter for screening for other disorder: Secondary | ICD-10-CM | POA: Diagnosis not present

## 2018-11-09 DIAGNOSIS — F909 Attention-deficit hyperactivity disorder, unspecified type: Secondary | ICD-10-CM | POA: Diagnosis not present

## 2018-11-09 DIAGNOSIS — F900 Attention-deficit hyperactivity disorder, predominantly inattentive type: Secondary | ICD-10-CM | POA: Diagnosis not present

## 2018-11-09 DIAGNOSIS — F4011 Social phobia, generalized: Secondary | ICD-10-CM | POA: Diagnosis not present

## 2018-11-23 DIAGNOSIS — F909 Attention-deficit hyperactivity disorder, unspecified type: Secondary | ICD-10-CM | POA: Diagnosis not present

## 2018-12-07 DIAGNOSIS — F909 Attention-deficit hyperactivity disorder, unspecified type: Secondary | ICD-10-CM | POA: Diagnosis not present

## 2018-12-28 DIAGNOSIS — F909 Attention-deficit hyperactivity disorder, unspecified type: Secondary | ICD-10-CM | POA: Diagnosis not present

## 2019-01-21 DIAGNOSIS — F909 Attention-deficit hyperactivity disorder, unspecified type: Secondary | ICD-10-CM | POA: Diagnosis not present

## 2019-01-21 DIAGNOSIS — F4011 Social phobia, generalized: Secondary | ICD-10-CM | POA: Diagnosis not present

## 2019-01-21 DIAGNOSIS — F900 Attention-deficit hyperactivity disorder, predominantly inattentive type: Secondary | ICD-10-CM | POA: Diagnosis not present

## 2019-01-28 DIAGNOSIS — H538 Other visual disturbances: Secondary | ICD-10-CM | POA: Diagnosis not present

## 2019-01-28 DIAGNOSIS — H1045 Other chronic allergic conjunctivitis: Secondary | ICD-10-CM | POA: Diagnosis not present

## 2019-01-28 DIAGNOSIS — H04123 Dry eye syndrome of bilateral lacrimal glands: Secondary | ICD-10-CM | POA: Diagnosis not present

## 2019-02-04 DIAGNOSIS — F909 Attention-deficit hyperactivity disorder, unspecified type: Secondary | ICD-10-CM | POA: Diagnosis not present

## 2019-02-18 DIAGNOSIS — F909 Attention-deficit hyperactivity disorder, unspecified type: Secondary | ICD-10-CM | POA: Diagnosis not present

## 2019-03-04 DIAGNOSIS — F909 Attention-deficit hyperactivity disorder, unspecified type: Secondary | ICD-10-CM | POA: Diagnosis not present

## 2019-03-11 DIAGNOSIS — R05 Cough: Secondary | ICD-10-CM | POA: Diagnosis not present

## 2019-03-11 DIAGNOSIS — J3081 Allergic rhinitis due to animal (cat) (dog) hair and dander: Secondary | ICD-10-CM | POA: Diagnosis not present

## 2019-03-11 DIAGNOSIS — J3089 Other allergic rhinitis: Secondary | ICD-10-CM | POA: Diagnosis not present

## 2019-03-11 DIAGNOSIS — J301 Allergic rhinitis due to pollen: Secondary | ICD-10-CM | POA: Diagnosis not present

## 2019-03-18 DIAGNOSIS — F909 Attention-deficit hyperactivity disorder, unspecified type: Secondary | ICD-10-CM | POA: Diagnosis not present

## 2019-04-15 DIAGNOSIS — F909 Attention-deficit hyperactivity disorder, unspecified type: Secondary | ICD-10-CM | POA: Diagnosis not present

## 2019-04-15 DIAGNOSIS — F4011 Social phobia, generalized: Secondary | ICD-10-CM | POA: Diagnosis not present

## 2019-04-15 DIAGNOSIS — F900 Attention-deficit hyperactivity disorder, predominantly inattentive type: Secondary | ICD-10-CM | POA: Diagnosis not present

## 2019-04-29 DIAGNOSIS — F909 Attention-deficit hyperactivity disorder, unspecified type: Secondary | ICD-10-CM | POA: Diagnosis not present

## 2019-05-13 DIAGNOSIS — F909 Attention-deficit hyperactivity disorder, unspecified type: Secondary | ICD-10-CM | POA: Diagnosis not present

## 2019-05-27 DIAGNOSIS — F909 Attention-deficit hyperactivity disorder, unspecified type: Secondary | ICD-10-CM | POA: Diagnosis not present

## 2019-06-10 DIAGNOSIS — F909 Attention-deficit hyperactivity disorder, unspecified type: Secondary | ICD-10-CM | POA: Diagnosis not present

## 2019-06-24 DIAGNOSIS — F909 Attention-deficit hyperactivity disorder, unspecified type: Secondary | ICD-10-CM | POA: Diagnosis not present

## 2019-07-12 DIAGNOSIS — F900 Attention-deficit hyperactivity disorder, predominantly inattentive type: Secondary | ICD-10-CM | POA: Diagnosis not present

## 2019-07-12 DIAGNOSIS — F4011 Social phobia, generalized: Secondary | ICD-10-CM | POA: Diagnosis not present

## 2019-08-05 DIAGNOSIS — F909 Attention-deficit hyperactivity disorder, unspecified type: Secondary | ICD-10-CM | POA: Diagnosis not present

## 2019-08-19 DIAGNOSIS — F909 Attention-deficit hyperactivity disorder, unspecified type: Secondary | ICD-10-CM | POA: Diagnosis not present

## 2019-09-16 DIAGNOSIS — F909 Attention-deficit hyperactivity disorder, unspecified type: Secondary | ICD-10-CM | POA: Diagnosis not present

## 2019-10-06 DIAGNOSIS — F900 Attention-deficit hyperactivity disorder, predominantly inattentive type: Secondary | ICD-10-CM | POA: Diagnosis not present

## 2019-10-06 DIAGNOSIS — F4011 Social phobia, generalized: Secondary | ICD-10-CM | POA: Diagnosis not present

## 2019-10-07 DIAGNOSIS — F909 Attention-deficit hyperactivity disorder, unspecified type: Secondary | ICD-10-CM | POA: Diagnosis not present

## 2019-10-21 DIAGNOSIS — F909 Attention-deficit hyperactivity disorder, unspecified type: Secondary | ICD-10-CM | POA: Diagnosis not present

## 2019-10-22 ENCOUNTER — Emergency Department (HOSPITAL_COMMUNITY): Payer: BC Managed Care – PPO

## 2019-10-22 ENCOUNTER — Encounter (HOSPITAL_COMMUNITY): Payer: Self-pay | Admitting: *Deleted

## 2019-10-22 ENCOUNTER — Other Ambulatory Visit: Payer: Self-pay

## 2019-10-22 ENCOUNTER — Ambulatory Visit (HOSPITAL_COMMUNITY): Payer: BC Managed Care – PPO

## 2019-10-22 ENCOUNTER — Ambulatory Visit (HOSPITAL_COMMUNITY): Payer: BC Managed Care – PPO | Admitting: Certified Registered"

## 2019-10-22 ENCOUNTER — Ambulatory Visit (HOSPITAL_COMMUNITY)
Admission: EM | Admit: 2019-10-22 | Discharge: 2019-10-22 | Disposition: A | Discharge status: Home / Self Care | Payer: BC Managed Care – PPO | Source: Ambulatory Visit | Attending: Orthopedic Surgery | Admitting: Orthopedic Surgery

## 2019-10-22 ENCOUNTER — Emergency Department (HOSPITAL_COMMUNITY)
Admission: EM | Admit: 2019-10-22 | Discharge: 2019-10-22 | Disposition: A | Discharge status: Other Acute Inpatient Hospital | Payer: BC Managed Care – PPO | Source: Home / Self Care | Attending: Emergency Medicine | Admitting: Emergency Medicine

## 2019-10-22 ENCOUNTER — Encounter (HOSPITAL_COMMUNITY): Payer: Self-pay

## 2019-10-22 ENCOUNTER — Encounter (HOSPITAL_COMMUNITY)
Admission: EM | Disposition: A | Discharge status: Home / Self Care | Payer: Self-pay | Source: Ambulatory Visit | Attending: Orthopedic Surgery

## 2019-10-22 DIAGNOSIS — S62326A Displaced fracture of shaft of fifth metacarpal bone, right hand, initial encounter for closed fracture: Secondary | ICD-10-CM | POA: Insufficient documentation

## 2019-10-22 DIAGNOSIS — W2209XA Striking against other stationary object, initial encounter: Secondary | ICD-10-CM | POA: Insufficient documentation

## 2019-10-22 DIAGNOSIS — S62306A Unspecified fracture of fifth metacarpal bone, right hand, initial encounter for closed fracture: Secondary | ICD-10-CM | POA: Diagnosis not present

## 2019-10-22 DIAGNOSIS — F845 Asperger's syndrome: Secondary | ICD-10-CM | POA: Insufficient documentation

## 2019-10-22 DIAGNOSIS — Y9289 Other specified places as the place of occurrence of the external cause: Secondary | ICD-10-CM | POA: Insufficient documentation

## 2019-10-22 DIAGNOSIS — F909 Attention-deficit hyperactivity disorder, unspecified type: Secondary | ICD-10-CM | POA: Diagnosis not present

## 2019-10-22 DIAGNOSIS — Z419 Encounter for procedure for purposes other than remedying health state, unspecified: Secondary | ICD-10-CM

## 2019-10-22 DIAGNOSIS — Y9389 Activity, other specified: Secondary | ICD-10-CM | POA: Insufficient documentation

## 2019-10-22 DIAGNOSIS — Y999 Unspecified external cause status: Secondary | ICD-10-CM | POA: Insufficient documentation

## 2019-10-22 DIAGNOSIS — Z79899 Other long term (current) drug therapy: Secondary | ICD-10-CM | POA: Insufficient documentation

## 2019-10-22 DIAGNOSIS — Z03818 Encounter for observation for suspected exposure to other biological agents ruled out: Secondary | ICD-10-CM | POA: Diagnosis not present

## 2019-10-22 DIAGNOSIS — F902 Attention-deficit hyperactivity disorder, combined type: Secondary | ICD-10-CM | POA: Insufficient documentation

## 2019-10-22 DIAGNOSIS — Z20828 Contact with and (suspected) exposure to other viral communicable diseases: Secondary | ICD-10-CM | POA: Insufficient documentation

## 2019-10-22 DIAGNOSIS — S62306D Unspecified fracture of fifth metacarpal bone, right hand, subsequent encounter for fracture with routine healing: Secondary | ICD-10-CM | POA: Diagnosis not present

## 2019-10-22 HISTORY — PX: OPEN REDUCTION INTERNAL FIXATION (ORIF) METACARPAL: SHX6234

## 2019-10-22 LAB — SARS CORONAVIRUS 2 BY RT PCR (HOSPITAL ORDER, PERFORMED IN ~~LOC~~ HOSPITAL LAB): SARS Coronavirus 2: NEGATIVE

## 2019-10-22 SURGERY — OPEN REDUCTION INTERNAL FIXATION (ORIF) METACARPAL
Anesthesia: General | Site: Finger | Laterality: Right

## 2019-10-22 MED ORDER — FENTANYL CITRATE (PF) 250 MCG/5ML IJ SOLN
INTRAMUSCULAR | Status: AC
  Filled 2019-10-22: qty 5

## 2019-10-22 MED ORDER — ONDANSETRON HCL 4 MG/2ML IJ SOLN
INTRAMUSCULAR | Status: DC | PRN
  Administered 2019-10-22: 4 mg via INTRAVENOUS

## 2019-10-22 MED ORDER — CHLORHEXIDINE GLUCONATE 4 % EX LIQD: 60.0000 mL | Freq: Once | CUTANEOUS | Status: DC

## 2019-10-22 MED ORDER — CEFAZOLIN SODIUM-DEXTROSE 2-4 GM/100ML-% IV SOLN
INTRAVENOUS | Status: AC
  Filled 2019-10-22: qty 100

## 2019-10-22 MED ORDER — MIDAZOLAM HCL 2 MG/2ML IJ SOLN
INTRAMUSCULAR | Status: AC
  Filled 2019-10-22: qty 2

## 2019-10-22 MED ORDER — LACTATED RINGERS IV SOLN
INTRAVENOUS | Status: DC | PRN
  Administered 2019-10-22: 17:00:00 via INTRAVENOUS

## 2019-10-22 MED ORDER — OXYCODONE HCL 5 MG/5ML PO SOLN: 5.0000 mg | Freq: Once | ORAL | Status: DC | PRN

## 2019-10-22 MED ORDER — CEFAZOLIN SODIUM-DEXTROSE 2-4 GM/100ML-% IV SOLN
2.0000 g | INTRAVENOUS | Status: AC
  Administered 2019-10-22: 2 g via INTRAVENOUS

## 2019-10-22 MED ORDER — BUPIVACAINE-EPINEPHRINE 0.5% -1:200000 IJ SOLN
INTRAMUSCULAR | Status: AC
  Filled 2019-10-22: qty 1

## 2019-10-22 MED ORDER — FENTANYL CITRATE (PF) 250 MCG/5ML IJ SOLN
INTRAMUSCULAR | Status: DC | PRN
  Administered 2019-10-22: 100 ug via INTRAVENOUS

## 2019-10-22 MED ORDER — LIDOCAINE 2% (20 MG/ML) 5 ML SYRINGE
INTRAMUSCULAR | Status: DC | PRN
  Administered 2019-10-22: 60 mg via INTRAVENOUS

## 2019-10-22 MED ORDER — POVIDONE-IODINE 10 % EX SWAB: 2.0000 "application " | Freq: Once | CUTANEOUS | Status: DC

## 2019-10-22 MED ORDER — FENTANYL CITRATE (PF) 100 MCG/2ML IJ SOLN: 25.0000 ug | INTRAMUSCULAR | Status: DC | PRN

## 2019-10-22 MED ORDER — OXYCODONE HCL 5 MG PO TABS: 5.0000 mg | ORAL_TABLET | Freq: Four times a day (QID) | ORAL | 0 refills | Status: AC | PRN

## 2019-10-22 MED ORDER — ACETAMINOPHEN 325 MG PO TABS: 650.0000 mg | ORAL_TABLET | Freq: Four times a day (QID) | ORAL | Status: AC

## 2019-10-22 MED ORDER — BUPIVACAINE-EPINEPHRINE 0.5% -1:200000 IJ SOLN
INTRAMUSCULAR | Status: DC | PRN
  Administered 2019-10-22: 10 mL

## 2019-10-22 MED ORDER — IBUPROFEN 200 MG PO TABS: 600.0000 mg | ORAL_TABLET | Freq: Four times a day (QID) | ORAL | Status: DC

## 2019-10-22 MED ORDER — PROPOFOL 10 MG/ML IV BOLUS
INTRAVENOUS | Status: AC
  Filled 2019-10-22: qty 20

## 2019-10-22 MED ORDER — OXYCODONE HCL 5 MG PO TABS: 5.0000 mg | ORAL_TABLET | Freq: Once | ORAL | Status: DC | PRN

## 2019-10-22 MED ORDER — DEXAMETHASONE SODIUM PHOSPHATE 10 MG/ML IJ SOLN
INTRAMUSCULAR | Status: DC | PRN
  Administered 2019-10-22: 5 mg via INTRAVENOUS

## 2019-10-22 MED ORDER — MIDAZOLAM HCL 2 MG/2ML IJ SOLN
INTRAMUSCULAR | Status: DC | PRN
  Administered 2019-10-22: 2 mg via INTRAVENOUS

## 2019-10-22 MED ORDER — ONDANSETRON HCL 4 MG/2ML IJ SOLN: 4.0000 mg | Freq: Once | INTRAMUSCULAR | Status: DC | PRN

## 2019-10-22 MED ORDER — PROPOFOL 10 MG/ML IV BOLUS
INTRAVENOUS | Status: DC | PRN
  Administered 2019-10-22: 200 mg via INTRAVENOUS

## 2019-10-22 SURGICAL SUPPLY — 46 items
APL PRP STRL LF DISP 70% ISPRP (MISCELLANEOUS) ×1
BLADE MINI RND TIP GREEN BEAV (BLADE) IMPLANT
BNDG CMPR 9X4 STRL LF SNTH (GAUZE/BANDAGES/DRESSINGS) ×1
BNDG COHESIVE 4X5 TAN STRL (GAUZE/BANDAGES/DRESSINGS) ×3 IMPLANT
BNDG ESMARK 4X9 LF (GAUZE/BANDAGES/DRESSINGS) ×3 IMPLANT
BNDG GAUZE ELAST 4 BULKY (GAUZE/BANDAGES/DRESSINGS) ×3 IMPLANT
CANISTER SUCT 3000ML PPV (MISCELLANEOUS) IMPLANT
CHLORAPREP W/TINT 26 (MISCELLANEOUS) ×3 IMPLANT
CORD BIPOLAR FORCEPS 12FT (ELECTRODE) ×3 IMPLANT
COVER MAYO STAND STRL (DRAPES) IMPLANT
COVER WAND RF STERILE (DRAPES) ×3 IMPLANT
CUFF TOURN SGL QUICK 18X4 (TOURNIQUET CUFF) IMPLANT
DRAPE C-ARM 42X72 X-RAY (DRAPES) ×3 IMPLANT
DRAPE SURG 17X23 STRL (DRAPES) ×3 IMPLANT
DRSG EMULSION OIL 3X3 NADH (GAUZE/BANDAGES/DRESSINGS) ×3 IMPLANT
GAUZE SPONGE 4X4 12PLY STRL (GAUZE/BANDAGES/DRESSINGS) ×2 IMPLANT
GAUZE SPONGE 4X4 12PLY STRL LF (GAUZE/BANDAGES/DRESSINGS) ×3 IMPLANT
GLOVE BIO SURGEON STRL SZ7.5 (GLOVE) ×3 IMPLANT
GLOVE BIOGEL PI IND STRL 7.0 (GLOVE) ×1 IMPLANT
GLOVE BIOGEL PI IND STRL 8 (GLOVE) ×1 IMPLANT
GLOVE BIOGEL PI INDICATOR 7.0 (GLOVE) ×2
GLOVE BIOGEL PI INDICATOR 8 (GLOVE) ×2
GLOVE ECLIPSE 6.5 STRL STRAW (GLOVE) ×3 IMPLANT
GOWN STRL REUS W/ TWL LRG LVL3 (GOWN DISPOSABLE) ×2 IMPLANT
GOWN STRL REUS W/ TWL XL LVL3 (GOWN DISPOSABLE) ×1 IMPLANT
GOWN STRL REUS W/TWL LRG LVL3 (GOWN DISPOSABLE) ×6
GOWN STRL REUS W/TWL XL LVL3 (GOWN DISPOSABLE) ×3
K-WIRE SURGICAL 1.6X102 (WIRE) ×4 IMPLANT
KIT BASIN OR (CUSTOM PROCEDURE TRAY) ×3 IMPLANT
NDL HYPO 25X1 1.5 SAFETY (NEEDLE) IMPLANT
NEEDLE HYPO 25X1 1.5 SAFETY (NEEDLE) IMPLANT
NS IRRIG 1000ML POUR BTL (IV SOLUTION) ×3 IMPLANT
PACK ORTHO EXTREMITY (CUSTOM PROCEDURE TRAY) ×3 IMPLANT
PAD CAST 4YDX4 CTTN HI CHSV (CAST SUPPLIES) IMPLANT
PADDING CAST COTTON 4X4 STRL (CAST SUPPLIES) ×3
SPLINT FIBERGLASS 3X12 (CAST SUPPLIES) ×2 IMPLANT
STOCKINETTE 6  STRL (DRAPES) ×2
STOCKINETTE 6 STRL (DRAPES) ×1 IMPLANT
SUT VICRYL RAPIDE 4/0 PS 2 (SUTURE) IMPLANT
SYR 10ML LL (SYRINGE) IMPLANT
TOWEL GREEN STERILE FF (TOWEL DISPOSABLE) ×3 IMPLANT
TOWEL OR NON WOVEN STRL DISP B (DISPOSABLE) IMPLANT
TUBE CONNECTING 20'X1/4 (TUBING)
TUBE CONNECTING 20X1/4 (TUBING) IMPLANT
UNDERPAD 30X30 (UNDERPADS AND DIAPERS) ×3 IMPLANT
k-wire 1.6mm (0.062) x 102mm (4") ×4 IMPLANT

## 2019-10-22 NOTE — Consult Note (Signed)
ORTHOPAEDIC CONSULTATION HISTORY & PHYSICAL REQUESTING PHYSICIAN: Pattricia Boss, MD  Chief Complaint: Right hand injury  HPI: Jeffrey Richardson is a 23 y.o. male who presented to the emergency department after self-inflicted injury to the right hand striking an immovable object.  The injury is closed.  X-rays have been obtained.  Past Medical History:  Diagnosis Date  . ADHD (attention deficit hyperactivity disorder)   . Asperger's disorder    History reviewed. No pertinent surgical history. Social History   Socioeconomic History  . Marital status: Single    Spouse name: Not on file  . Number of children: Not on file  . Years of education: Not on file  . Highest education level: Not on file  Occupational History  . Not on file  Social Needs  . Financial resource strain: Not on file  . Food insecurity    Worry: Not on file    Inability: Not on file  . Transportation needs    Medical: Not on file    Non-medical: Not on file  Tobacco Use  . Smoking status: Never Smoker  . Smokeless tobacco: Never Used  Substance and Sexual Activity  . Alcohol use: No  . Drug use: No  . Sexual activity: Never  Lifestyle  . Physical activity    Days per week: Not on file    Minutes per session: Not on file  . Stress: Not on file  Relationships  . Social Herbalist on phone: Not on file    Gets together: Not on file    Attends religious service: Not on file    Active member of club or organization: Not on file    Attends meetings of clubs or organizations: Not on file    Relationship status: Not on file  Other Topics Concern  . Not on file  Social History Narrative  . Not on file   Family History  Adopted: Yes   No Known Allergies Prior to Admission medications   Medication Sig Start Date End Date Taking? Authorizing Provider  dextroamphetamine (DEXTROSTAT) 5 MG tablet Take 1 tablet (5 mg total) by mouth 2 (two) times daily with breakfast and lunch. 05/21/16    Kathlee Nations, MD  EPINEPHrine 0.3 mg/0.3 mL IJ SOAJ injection  04/04/16   [provider]  fluticasone Asencion Islam) 50 MCG/ACT nasal spray  02/07/15   [provider]  levocetirizine (XYZAL) 5 MG tablet  04/03/15   [provider]  montelukast (SINGULAIR) 10 MG tablet  04/03/15   [provider]  omeprazole (PRILOSEC) 40 MG capsule  04/29/16   [provider]   Dg Hand Complete Right  Result Date: 10/22/2019 CLINICAL DATA:  Punched wall, hand pain and swelling EXAM: RIGHT HAND - COMPLETE 3+ VIEW COMPARISON:  None. FINDINGS: There is a fracture through the mid shaft of the right fifth metacarpal with slight distraction and apex ulnar angulation. There is adjacent soft tissue swelling. No additional fracture or malalignment. IMPRESSION: Acute fracture of the midshaft of the right fifth metacarpal. Electronically Signed   By: Macy Mis M.D.   On: 10/22/2019 13:34    Positive ROS: All other systems have been reviewed and were otherwise negative with the exception of those mentioned in the HPI and as above.  Physical Exam: Vitals: Refer to EMR. Constitutional:  WD, WN, NAD HEENT:  NCAT, EOMI Neuro/Psych:  Alert & oriented to person, place, and time; appropriate mood & affect Lymphatic: No generalized extremity edema  or lymphadenopathy Extremities / MSK:  The extremities are normal with respect to appearance, ranges of motion, joint stability, muscle strength/tone, sensation, & perfusion except as otherwise noted:  Right hand is swollen and tender with obvious apex dorsal deformity of the fifth metacarpal midshaft.  Mild crossover deformity with ring finger.  Flexor and extensor tendons appear to be intact.  Assessment: Displaced angulated right fifth metacarpal shaft fracture  Plan: Patient confirmed he been n.p.o. since around 730 this morning.  I discussed with him the indications for reducing and stabilizing the fracture, explaining the goals,  risk, and options and consent was obtained.  Informed consent document executed.  His Covid testing swab is been obtained, and he is scheduled for surgery at [facility] where he will be transferred imminently.  He will likely be discharged home following surgery today.  Cliffton Asters Janee Morn, MD      Orthopaedic & Hand Surgery [facility]opaedic [facility] Sports Medicine [facility] [address] [place], [place]  [address] Office: [phone] Mobile: [phone]  10/22/2019, 2:54 PM

## 2019-10-22 NOTE — Transfer of Care (Signed)
Immediate Anesthesia Transfer of Care Note  Patient: Jeffrey Richardson  Procedure(s) Performed: Zenon Mayo RIGHT FIFTH METACARPAL (Right Finger)  Patient Location: PACU  Anesthesia Type:General  Level of Consciousness: drowsy and patient cooperative  Airway & Oxygen Therapy: Patient Spontanous Breathing  Post-op Assessment: Report given to RN  Post vital signs: Reviewed and stable  Last Vitals:  Vitals Value Taken Time  BP    Temp    Pulse 69 10/22/19 1744  Resp    SpO2 95 % 10/22/19 1744  Vitals shown include unvalidated device data.  Last Pain:  Vitals:   10/22/19 1601  TempSrc: Oral  PainSc: 5       Patients Stated Pain Goal: 0 (22/63/33 5456)  Complications: No apparent anesthesia complications

## 2019-10-22 NOTE — Consult Note (Deleted)
  The note originally documented on this encounter has been moved the the encounter in which it belongs.  

## 2019-10-22 NOTE — Anesthesia Preprocedure Evaluation (Signed)

## 2019-10-22 NOTE — ED Provider Notes (Signed)
[facility] DEPT Provider Note   CSN: [id number] Arrival date & time: 10/22/19  1240     History   Chief Complaint Chief Complaint  Patient presents with  . Hand Pain    HPI ALVAR MALINOSKI is a 23 y.o. male with a past medical history significant for anxiety, seasonal allergies, ADHD, and asthma who presents to the ED for an evaluation of constant, non-radiating, right hand pain that started today around 11:30am. Patient notes he punched a wall and had a sudden onset of right pinky pain following the accident. Patient is right handed. Pain is worse with movement. Denies previous injury. Patient denies any open lacerations following the accident. Pain is associated with edema and limited ROM due to pain. He has tried ibuprofen today at noon with moderate relief. Patient denies shortness of breath, chest pain, and other injuries.   Past Medical History:  Diagnosis Date  . ADHD (attention deficit hyperactivity disorder)   . Asperger's disorder     Patient Active Problem List   Diagnosis Date Noted  . Asperger syndrome 02/09/2015  . ADHD (attention deficit hyperactivity disorder), combined type 06/02/2012    History reviewed. No pertinent surgical history.      Home Medications    Prior to Admission medications   Medication Sig Start Date End Date Taking? Authorizing Provider  dextroamphetamine (DEXTROSTAT) 5 MG tablet Take 1 tablet (5 mg total) by mouth 2 (two) times daily with breakfast and lunch. 05/21/16   Kathlee Nations, MD  EPINEPHrine 0.3 mg/0.3 mL IJ SOAJ injection  04/04/16   [provider]  fluticasone Asencion Islam) 50 MCG/ACT nasal spray  02/07/15   [provider]  levocetirizine (XYZAL) 5 MG tablet  04/03/15   [provider]  montelukast (SINGULAIR) 10 MG tablet  04/03/15   [provider]  omeprazole (PRILOSEC) 40 MG capsule  04/29/16   [provider]    Family History Family History   Adopted: Yes    Social History Social History   Tobacco Use  . Smoking status: Never Smoker  . Smokeless tobacco: Never Used  Substance Use Topics  . Alcohol use: No  . Drug use: No     Allergies   Patient has no known allergies.   Review of Systems Review of Systems  Respiratory: Negative for shortness of breath.   Cardiovascular: Negative for chest pain.  Musculoskeletal: Positive for arthralgias and joint swelling.  Skin: Positive for color change.  Neurological: Negative for weakness and numbness.     Physical Exam Updated Vital Signs BP (!) 145/99 (BP Location: Right Arm)   Pulse 80   Temp 98.6 F (37 C) (Oral)   Resp 18   SpO2 99%   Physical Exam Constitutional:      General: He is not in acute distress.    Appearance: He is not ill-appearing.  HENT:     Head: Normocephalic.  Eyes:     Conjunctiva/sclera: Conjunctivae normal.  Neck:     Musculoskeletal: Neck supple.  Cardiovascular:     Rate and Rhythm: Normal rate and regular rhythm.  Pulmonary:     Effort: Pulmonary effort is normal.     Breath sounds: Normal breath sounds.  Musculoskeletal:        General: Swelling, tenderness and signs of injury present.     Comments: Tenderness to palpation over dorsal aspect of 5th metacarpal and distal aspect of 4th metacarpal. Edema present over 5th metacarpal. Radial pulse intact bilaterally. Sensation  intact bilaterally. No snuffbox tenderness.   Skin:    Coloration: Skin is not jaundiced.     Comments: Small abrasion over PIP joint of 3rd finger  Neurological:     Mental Status: He is alert. Mental status is at baseline.      ED Treatments / Results  Labs (all labs ordered are listed, but only abnormal results are displayed) Labs Reviewed  SARS CORONAVIRUS 2 BY RT PCR (HOSPITAL ORDER, PERFORMED IN [facility])    EKG None  Radiology Dg Hand Complete Right  Result Date: 10/22/2019 CLINICAL DATA:  Punched wall, hand pain and  swelling EXAM: RIGHT HAND - COMPLETE 3+ VIEW COMPARISON:  None. FINDINGS: There is a fracture through the mid shaft of the right fifth metacarpal with slight distraction and apex ulnar angulation. There is adjacent soft tissue swelling. No additional fracture or malalignment. IMPRESSION: Acute fracture of the midshaft of the right fifth metacarpal. Electronically Signed   By: Guadlupe Spanish M.D.   On: 10/22/2019 13:34    Procedures Procedures (including critical care time)  Medications Ordered in ED Medications - No data to display   Initial Impression / Assessment and Plan / ED Course  I have reviewed the triage vital signs and the nursing notes.  Pertinent labs & imaging results that were available during my care of the patient were reviewed by me and considered in my medical decision making (see chart for details).       Kristen Bushway is a 23 year old male who presents to the ED after a right hand injury. Patient is in no acute distress and non-ill appearing. Vitals WNL except for mildly elevated BP at 145/99. On physical exam, patient has tenderness over 5th metacarpal and distal portion of 4th metacarpal with surrounding edema over 5th metacarpal. Neurovascularly intact bilaterally. Full ROM, but causes pain. Grip strength 5/5 bilaterally. X-ray ordered at triage. X-ray reviewed and demonstrates acute midshaft fracture of 5th metacarpal. Jodi Geralds, PA-C spoke Jamelle Rushing who will evaluate patient in the ER. Jodi Geralds, PA-C spoke to Jamelle Rushing again who plans to take patient to OR for ORIF at [facility]. Patient will be discharged and drive himself to [facility] with his family to admissions. Rapid COVID test ordered.    Final Clinical Impressions(s) / ED Diagnoses   Final diagnoses:  Closed displaced fracture of shaft of fifth metacarpal bone of right hand, initial encounter    ED Discharge Orders    None       Renee Harder, PA-C 10/22/19 1454    Margarita Grizzle, MD  10/22/19 [phone]

## 2019-10-22 NOTE — ED Triage Notes (Signed)
Pt c/o R hand pain and swelling since 1200 today. Pt states he punched a wall.

## 2019-10-22 NOTE — Discharge Instructions (Addendum)
Discharge Instructions   You have a dressing with a plaster splint incorporated in it. Move your fingers as much as possible, making a full fist and fully opening the fist. Elevate your hand to reduce pain & swelling of the digits.  Ice over the operative site may be helpful to reduce pain & swelling.  DO NOT USE HEAT. Pain medicine has been prescribed for you to use for pain that tylenol and ibuprofen together has not yet controlled Leave the dressing in place until you return to our office.  You may shower, but keep the bandage clean & dry.  You may drive a car when you are off of prescription pain medications and can safely control your vehicle with both hands. Our office will call you to arrange follow-up   Please call [phone] during normal business hours or [phone] after hours for any problems. Including the following:  - excessive redness of the incisions - drainage for more than 4 days - fever of more than 101.5 F  *Please note that pain medications will not be refilled after hours or on weekends.  WORK STATUS: With the right hand, must keep bandage clean and dry, with no lifting, gripping, grasping greater than paper/pencil tasks.

## 2019-10-22 NOTE — Anesthesia Procedure Notes (Signed)
Procedure Name: LMA Insertion Date/Time: 10/22/2019 5:19 PM Performed by: Barrington Ellison, CRNA Pre-anesthesia Checklist: Patient identified, Emergency Drugs available, Suction available and Patient being monitored Patient Re-evaluated:Patient Re-evaluated prior to induction Oxygen Delivery Method: Circle System Utilized Preoxygenation: Pre-oxygenation with 100% oxygen Induction Type: IV induction Ventilation: Mask ventilation without difficulty LMA: LMA inserted LMA Size: 4.0 Number of attempts: 1 Placement Confirmation: positive ETCO2 Tube secured with: Tape Dental Injury: Teeth and Oropharynx as per pre-operative assessment

## 2019-10-22 NOTE — ED Notes (Signed)
An After Visit Summary was printed and given to the patient. Discharge instructions given and no further questions at this time. Pt informed to go straight to [facility].

## 2019-10-22 NOTE — Discharge Instructions (Signed)
Go directly to [facility] admissions department, do not have anything to eat or drink do not make any stops on the way.  You will be admitted for surgery upon arrival.

## 2019-10-22 NOTE — Anesthesia Postprocedure Evaluation (Signed)
Anesthesia Post Note  Patient: Jeffrey Richardson  Procedure(s) Performed: Zenon Mayo RIGHT FIFTH METACARPAL (Right Finger)     Patient location during evaluation: PACU Anesthesia Type: General Level of consciousness: awake and alert Pain management: pain level controlled Vital Signs Assessment: post-procedure vital signs reviewed and stable Respiratory status: spontaneous breathing, nonlabored ventilation, respiratory function stable and patient connected to nasal cannula oxygen Cardiovascular status: blood pressure returned to baseline and stable Postop Assessment: no apparent nausea or vomiting Anesthetic complications: no    Last Vitals:  Vitals:   10/22/19 1601 10/22/19 1745  BP: 140/66 (!) 105/45  Pulse: 94 68  Resp: 18 15  Temp: 36.8 C (!) 36.1 C  SpO2: 97% 94%    Last Pain:  Vitals:   10/22/19 1745  TempSrc:   PainSc: Asleep                 Mulan Adan COKER

## 2019-10-22 NOTE — Op Note (Addendum)
10/22/2019  3:53 PM  PATIENT:  Jeffrey Richardson  23 y.o. male  PRE-OPERATIVE DIAGNOSIS:  Displaced R 5 MC fx  POST-OPERATIVE DIAGNOSIS:  Same  PROCEDURE:  CRPP R 5 MC fx  SURGEON: Rayvon Char. Grandville Silos, MD  PHYSICIAN ASSISTANT: none  ANESTHESIA:  general  SPECIMENS:  None  DRAINS:   None  EBL:  less than 50 mL  PREOPERATIVE INDICATIONS:  Jeffrey Richardson is a  23 y.o. male with a displaced angulated R 5 MC fx  The risks benefits and alternatives were discussed with the patient preoperatively including but not limited to the risks of infection, bleeding, nerve injury, cardiopulmonary complications, the need for revision surgery, among others, and the patient verbalized understanding and consented to proceed.  OPERATIVE IMPLANTS: 0.062 kwire x 1  OPERATIVE PROCEDURE:  After receiving prophylactic antibiotics, the patient was escorted to the operative theatre and placed in a supine position.  General anesthesia was administered. A surgical "time-out" was performed during which the planned procedure, proposed operative site, and the correct patient identity were compared to the operative consent and agreement confirmed by the circulating nurse according to current facility policy.  Following application of a tourniquet to the operative extremity, the exposed skin was prepped with Chloraprep and draped in the usual sterile fashion.    The fracture was provisionally reduced using fluoroscopic guidance.  The base of the fifth metacarpal was ascertained fluoroscopically and marked, small incision made with a 15 blade.  Spreading dissection was carried down to the bone to ensure that the extensor tendon was free and clear.  A 0.062 inch K wire was then driven obliquely through the dorsal cortex of the base of the fifth metacarpal, distal to the [facility] joint.  It was removed, the and clipped off to be more blunt and square in the end bent slightly into a skid shape.  It was then advanced by hand  into the metacarpal, down the shaft and across the fracture site, providing both reduction and stabilization of the fracture.  It was advanced into the metacarpal head.  Manually the metacarpal was impacted to better reapproximate the fracture ends and manipulated with three-point bending to achieve a more anatomic alignment of the metacarpal shaft.  The K wire was then bent over at the skin 90, 90 and clipped.  Final images were obtained.  Half percent Marcaine with epinephrine was instilled around the pin site and also at the level of the wrist to perform an ulnar nerve block.  A short arm splint dressing was applied with a volar fiberglass component and he was awakened and taken to the recovery room in stable condition, breathing spontaneously.  DISPOSITION: He will be discharged home adaptable instructions, returning in 1 to 2 weeks, with new x-rays of the right hand out of splint and conversion to short arm cast.

## 2019-10-22 NOTE — Progress Notes (Signed)
No labs needed at this time per Dr. Linna Caprice.

## 2019-10-26 ENCOUNTER — Encounter (HOSPITAL_COMMUNITY): Payer: Self-pay | Admitting: Orthopedic Surgery

## 2019-10-28 DIAGNOSIS — S62326A Displaced fracture of shaft of fifth metacarpal bone, right hand, initial encounter for closed fracture: Secondary | ICD-10-CM | POA: Diagnosis not present

## 2019-11-04 DIAGNOSIS — F909 Attention-deficit hyperactivity disorder, unspecified type: Secondary | ICD-10-CM | POA: Diagnosis not present

## 2019-11-29 ENCOUNTER — Telehealth: Payer: BC Managed Care – PPO | Admitting: Physician Assistant

## 2019-11-29 DIAGNOSIS — Z20822 Contact with and (suspected) exposure to covid-19: Secondary | ICD-10-CM

## 2019-11-29 NOTE — Progress Notes (Signed)
E-Visit for Corona Virus Screening   Your current symptoms could be consistent with the coronavirus.  Many health care providers can now test patients at their office but not all are.  [place] has multiple testing sites. For information on our COVID testing locations and hours go to [email or web address]  Please quarantine yourself while awaiting your test results.  We are enrolling you in our MyChart Home [place] for COVID19 . Daily you will receive a questionnaire within the MyChart website. Our COVID 19 response team willl be monitoriing your responses daily. Please continue good preventive care measures, including:  frequent hand-washing, avoid touching your face, cover coughs/sneezes, stay out of crowds and keep a 6 foot distance from others.    COVID-19 is a respiratory illness with symptoms that are similar to the flu. Symptoms are typically mild to moderate, but there have been cases of severe illness and death due to the virus. The following symptoms may appear 2-14 days after exposure: . Fever . Cough . Shortness of breath or difficulty breathing . Chills . Repeated shaking with chills . Muscle pain . Headache . Sore throat . New loss of taste or smell . Fatigue . Congestion or runny nose . Nausea or vomiting . Diarrhea  If you develop fever/cough/breathlessness, please stay home for 10 days with improving symptoms and until you have had 24 hours of no fever (without taking a fever reducer).  Go to the nearest hospital ED for assessment if fever/cough/breathlessness are severe or illness seems like a threat to life.  It is vitally important that if you feel that you have an infection such as this virus or any other virus that you stay home and away from places where you may spread it to others.  You should avoid contact with people age 5 and older.   You should wear a mask or cloth face covering over your nose and mouth if you must be around other  people or animals, including pets (even at home). Try to stay at least 6 feet away from other people. This will protect the people around you.  You can use medication such as over the counter cough and cold medication  You may also take acetaminophen (Tylenol) as needed for fever.   Reduce your risk of any infection by using the same precautions used for avoiding the common cold or flu:  Marland Kitchen Wash your hands often with soap and warm water for at least 20 seconds.  If soap and water are not readily available, use an alcohol-based hand sanitizer with at least 60% alcohol.  . If coughing or sneezing, cover your mouth and nose by coughing or sneezing into the elbow areas of your shirt or coat, into a tissue or into your sleeve (not your hands). . Avoid shaking hands with others and consider head nods or verbal greetings only. . Avoid touching your eyes, nose, or mouth with unwashed hands.  . Avoid close contact with people who are sick. . Avoid places or events with large numbers of people in one location, like concerts or sporting events. . Carefully consider travel plans you have or are making. . If you are planning any travel outside or inside the [place], visit the CDC's Travelers' Health webpage for the latest health notices. . If you have some symptoms but not all symptoms, continue to monitor at home and seek medical attention if your symptoms worsen. . If you are having a medical emergency, call 911.  HOME CARE . Only take  medications as instructed by your medical team. . Drink plenty of fluids and get plenty of rest. . A steam or ultrasonic humidifier can help if you have congestion.   GET HELP RIGHT AWAY IF YOU HAVE EMERGENCY WARNING SIGNS** FOR COVID-19. If you or someone is showing any of these signs seek emergency medical care immediately. Call 911 or proceed to your closest emergency facility if: . You develop worsening high fever. . Trouble breathing . Bluish lips or face . Persistent  pain or pressure in the chest . New confusion . Inability to wake or stay awake . You cough up blood. . Your symptoms become more severe  **This list is not all possible symptoms. Contact your medical provider for any symptoms that are sever or concerning to you.   MAKE SURE YOU   Understand these instructions.  Will watch your condition.  Will get help right away if you are not doing well or get worse.  Your e-visit answers were reviewed by a board certified advanced clinical practitioner to complete your personal care plan.  Depending on the condition, your plan could have included both over the counter or prescription medications.  If there is a problem please reply once you have received a response from your provider.  Your safety is important to [place].  If you have drug allergies check your prescription carefully.    You can use MyChart to ask questions about today's visit, request a non-urgent call back, or ask for a work or school excuse for 24 hours related to this e-Visit. If it has been greater than 24 hours you will need to follow up with your provider, or enter a new e-Visit to address those concerns. You will get an e-mail in the next two days asking about your experience.  I hope that your e-visit has been valuable and will speed your recovery. Thank you for using e-visits.  5 minutes spent on this chart

## 2019-12-01 DIAGNOSIS — Z20828 Contact with and (suspected) exposure to other viral communicable diseases: Secondary | ICD-10-CM | POA: Diagnosis not present

## 2019-12-01 DIAGNOSIS — Z6822 Body mass index (BMI) 22.0-22.9, adult: Secondary | ICD-10-CM | POA: Diagnosis not present

## 2019-12-02 ENCOUNTER — Other Ambulatory Visit: Payer: Self-pay

## 2019-12-02 DIAGNOSIS — Z20822 Contact with and (suspected) exposure to covid-19: Secondary | ICD-10-CM

## 2019-12-03 LAB — NOVEL CORONAVIRUS, NAA: SARS-CoV-2, NAA: DETECTED — AB

## 2020-02-14 DIAGNOSIS — F909 Attention-deficit hyperactivity disorder, unspecified type: Secondary | ICD-10-CM | POA: Diagnosis not present

## 2020-02-24 IMAGING — RF DG HAND COMPLETE 3+V*R*
1 series · 3 of 3 positions shown · non-contrast
Comparison: Radiographs 10/22/2019

CLINICAL DATA: ORIF right fifth metacarpal

EXAM:
RIGHT HAND - COMPLETE 3+ VIEW; DG C-ARM 1-60 MIN

[Series 1: run · 3 of 3 slices shown]
[im 1/3]
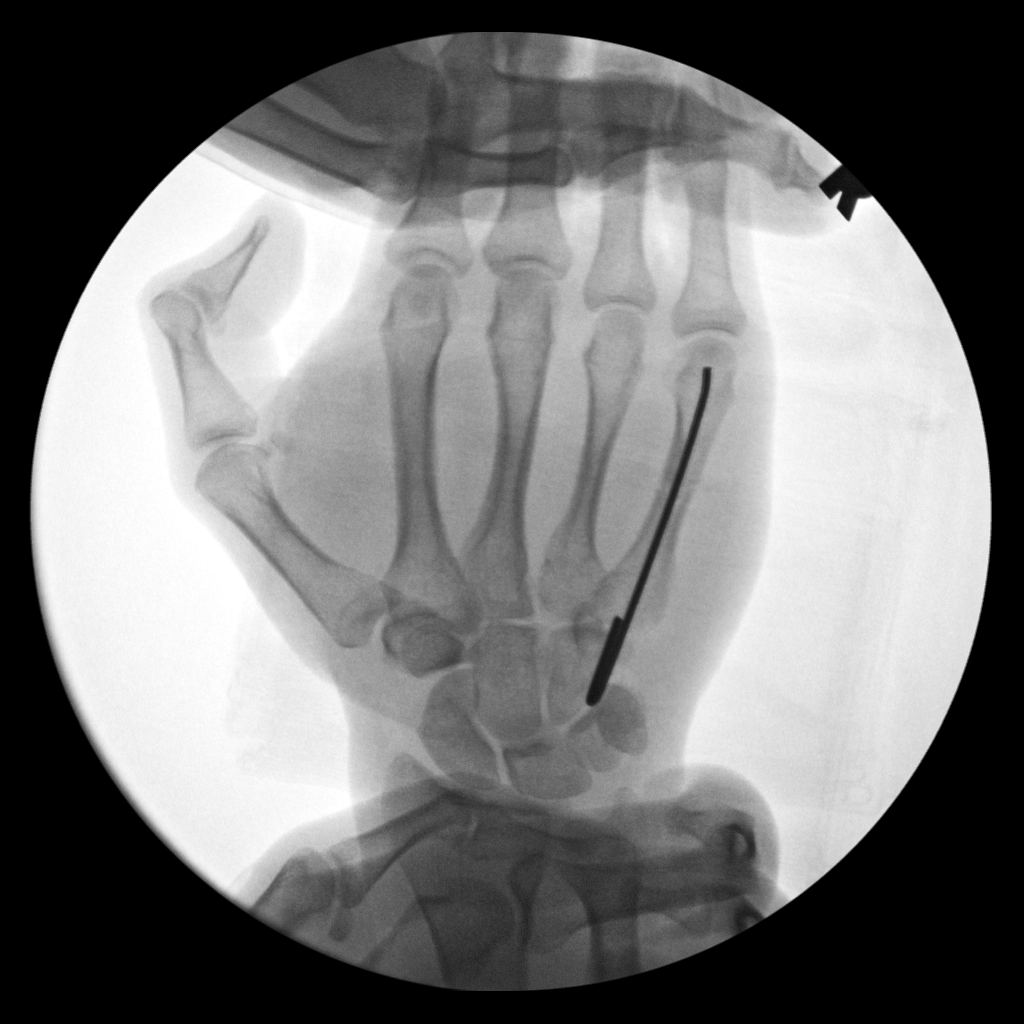
[im 2/3]
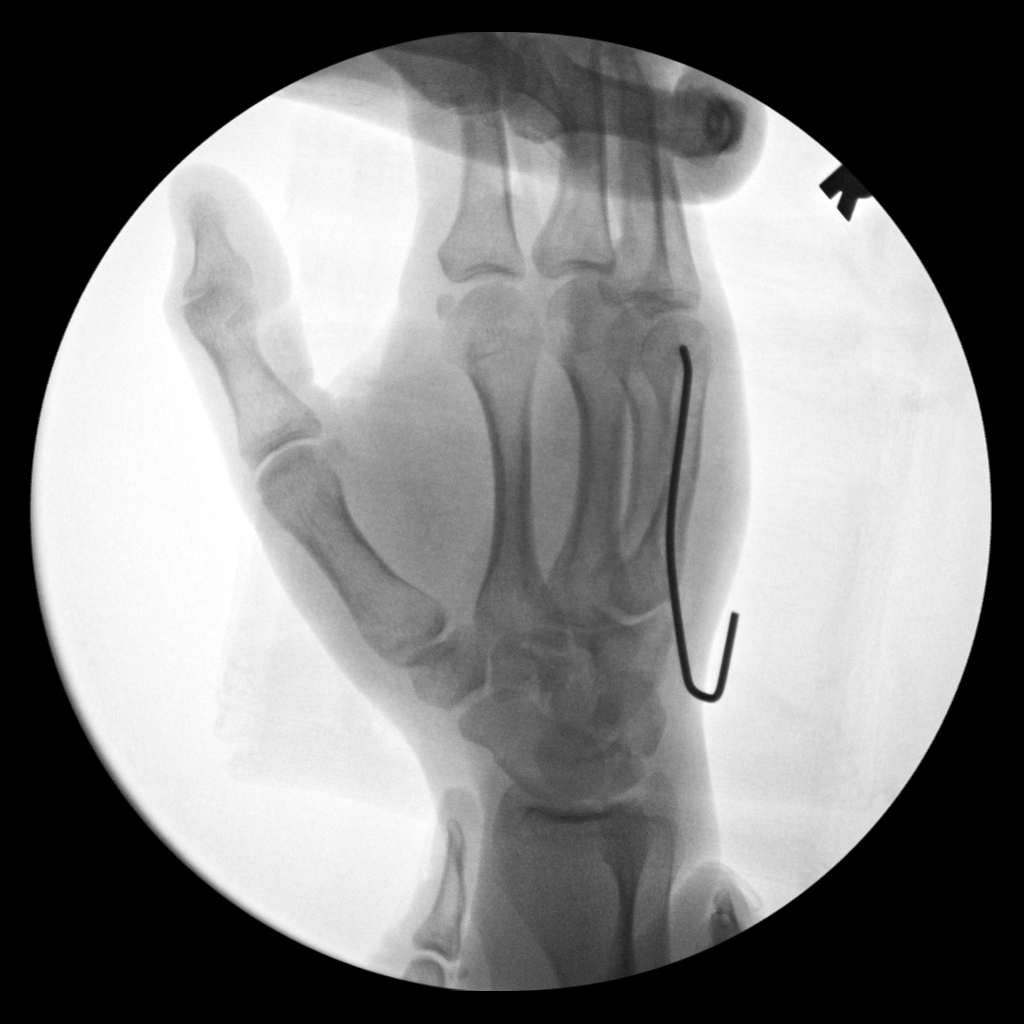
[im 3/3]
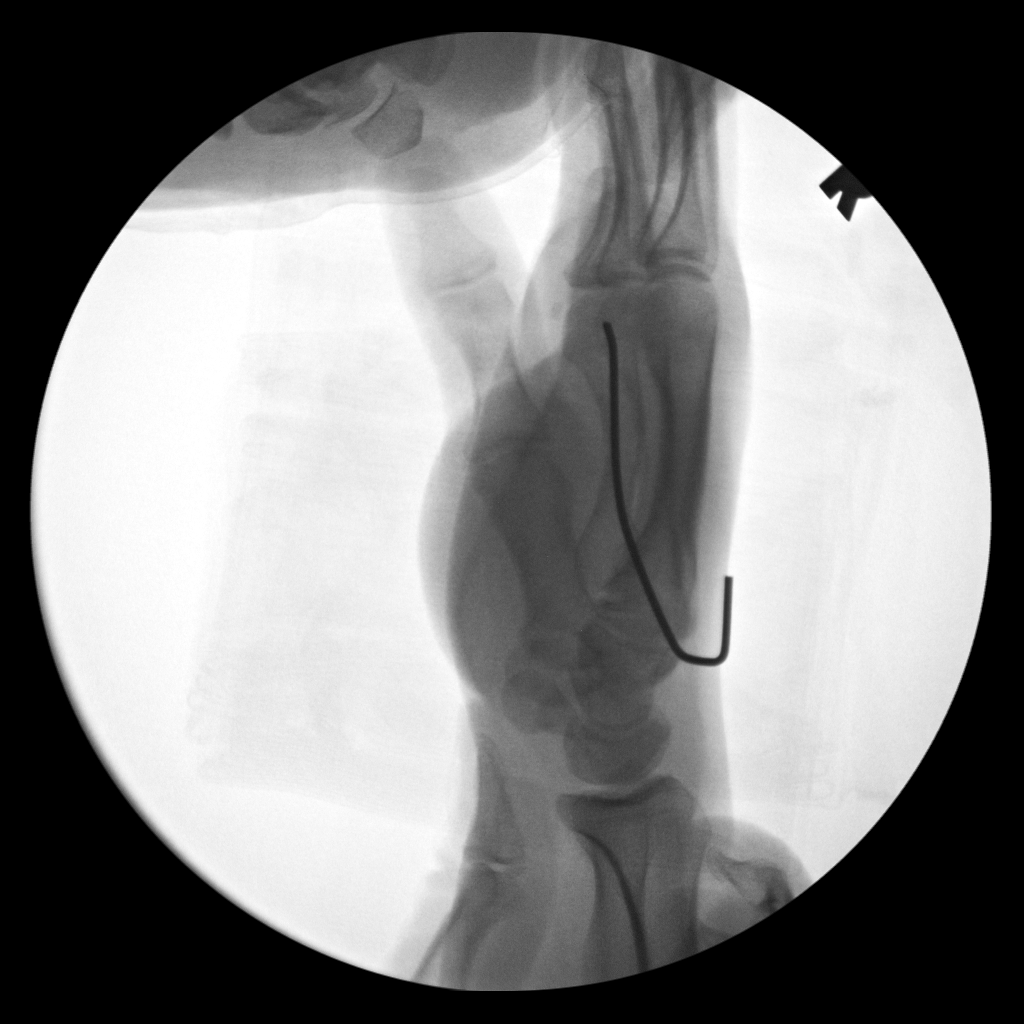

[3 of 3 positions shown; findings below may reference images not displayed]

FINDINGS: Intraoperative fluoroscopic images demonstrate plate placement of a
metallic Recalde wire transfixing the dorsal apically angulated
fracture of the fifth metacarpal diaphysis, now with improved, near
anatomic alignment post reduction. No additional fractures are seen.
Soft tissues are unremarkable aside from expected postsurgical
changes.
IMPRESSION: ORIF of the fifth metacarpal fracture, now with improved, near
anatomic alignment post reduction.

## 2020-02-24 IMAGING — CR DG HAND COMPLETE 3+V*R*
3 series · 3 of 3 positions shown · non-contrast
Comparison: None.

CLINICAL DATA: Punched wall, hand pain and swelling

EXAM:
RIGHT HAND - COMPLETE 3+ VIEW

[x hand pa right]
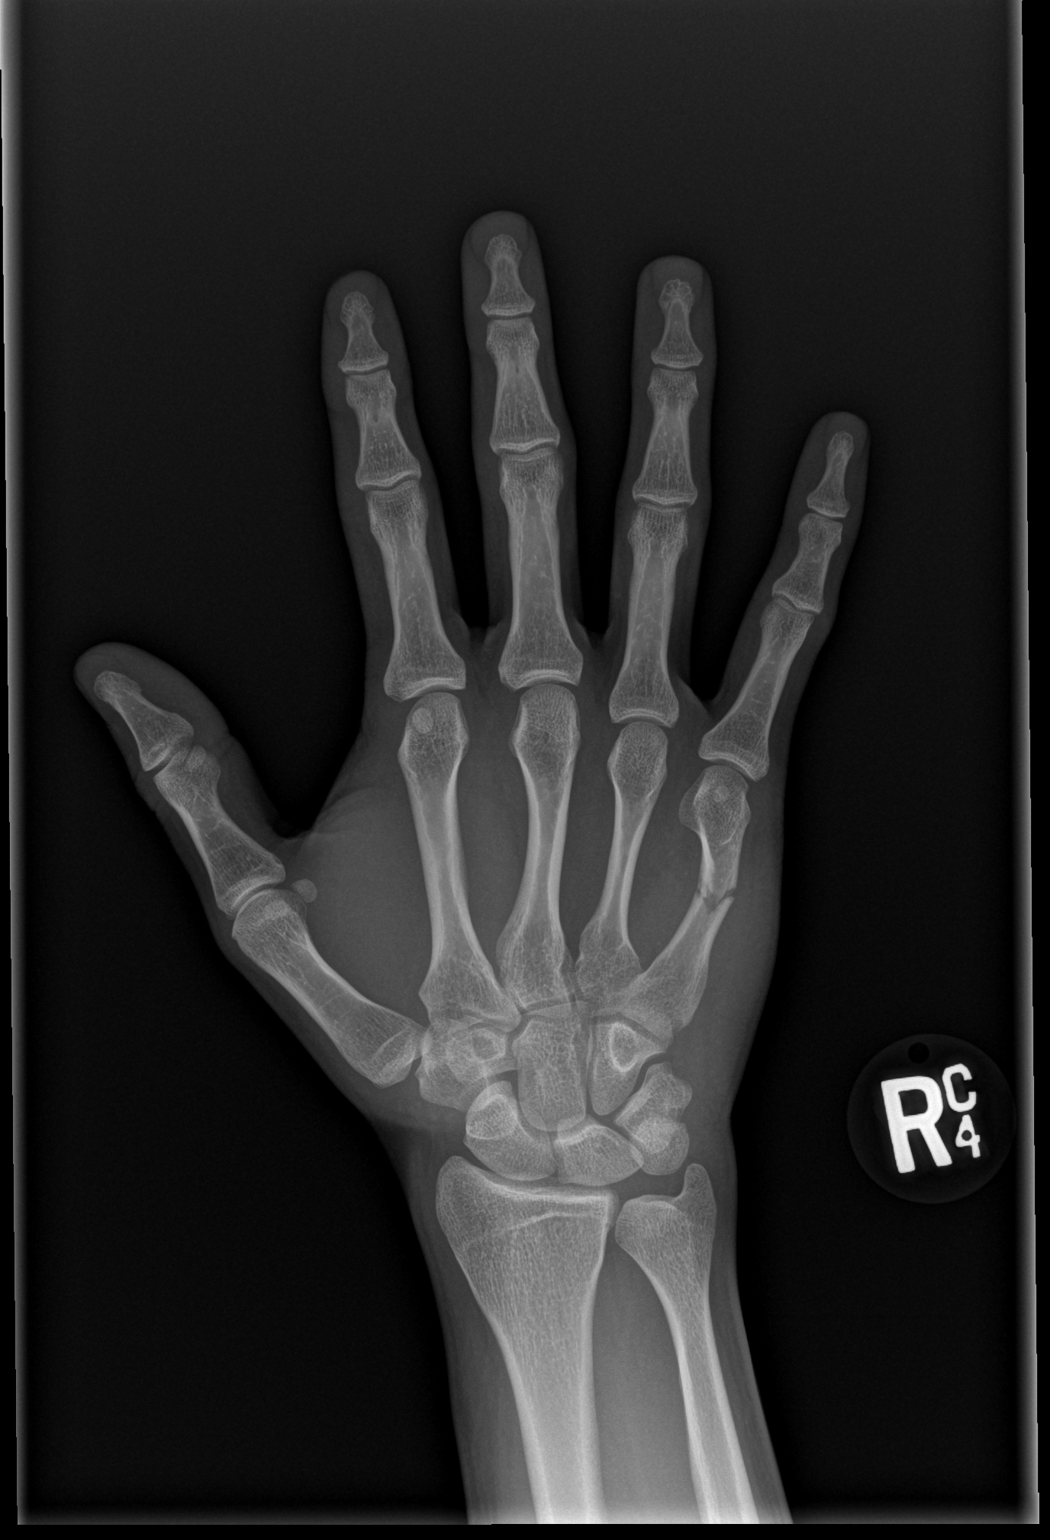

[x hand obl right]
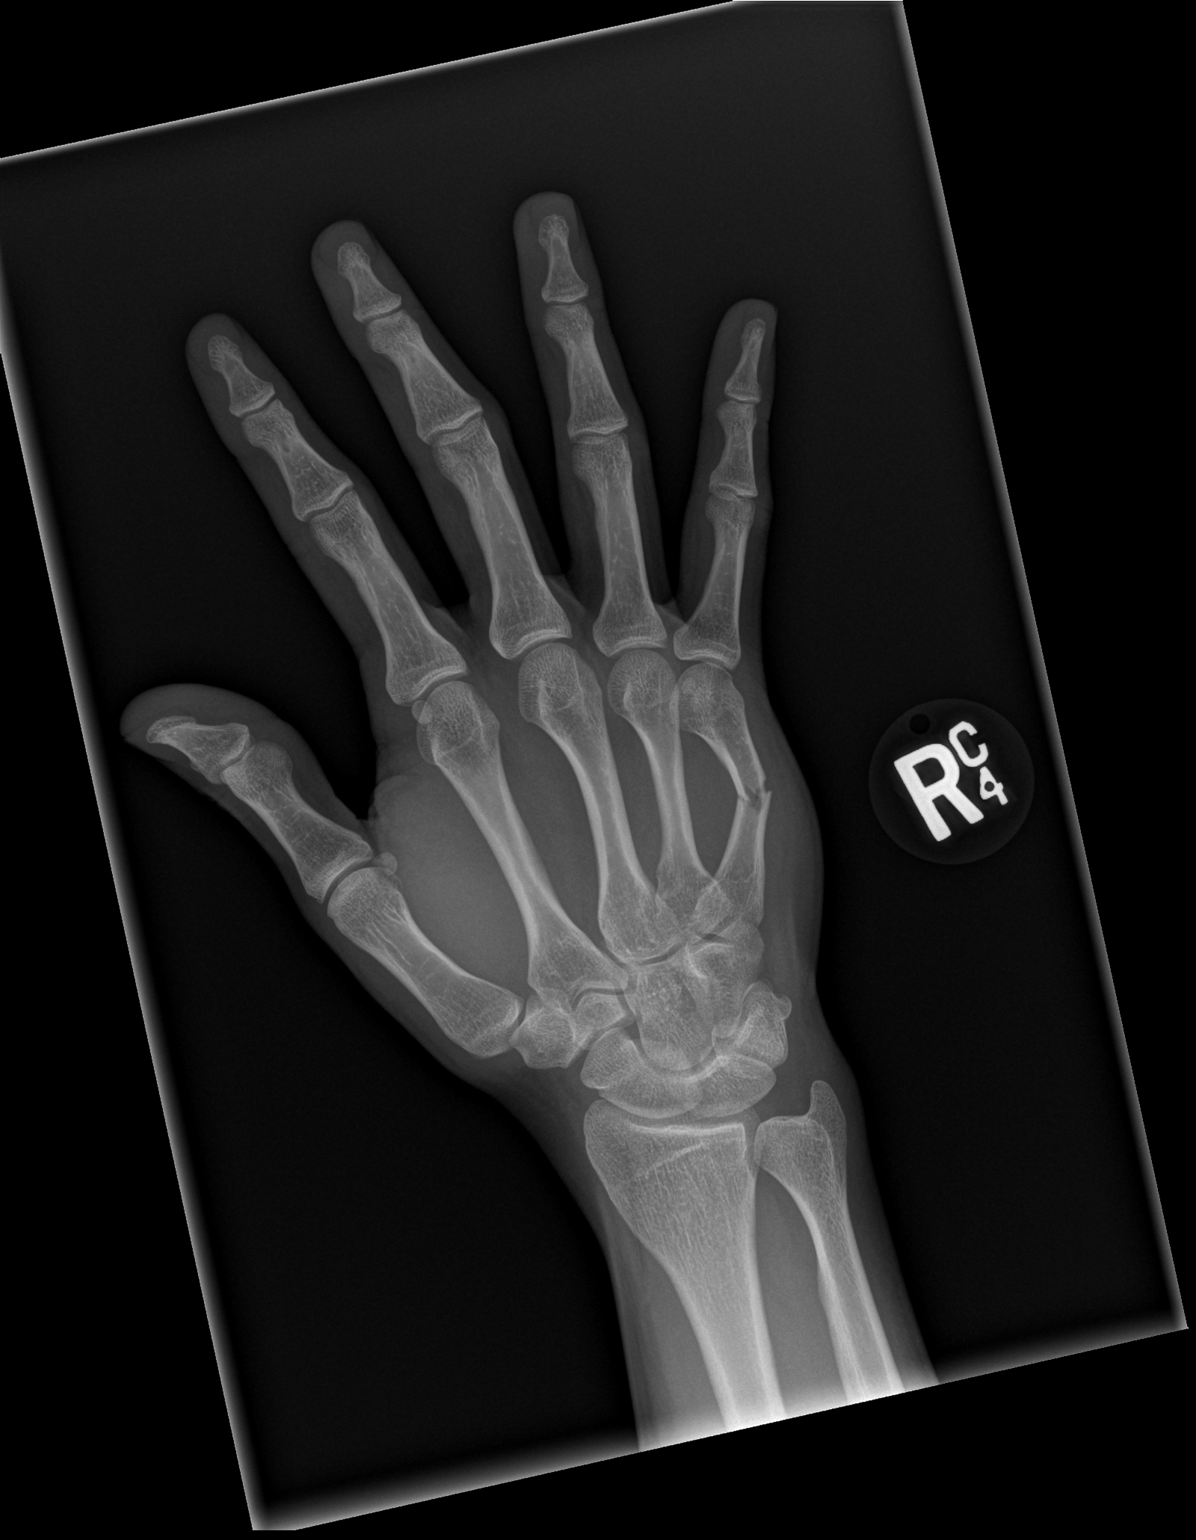

[x hand lat right]
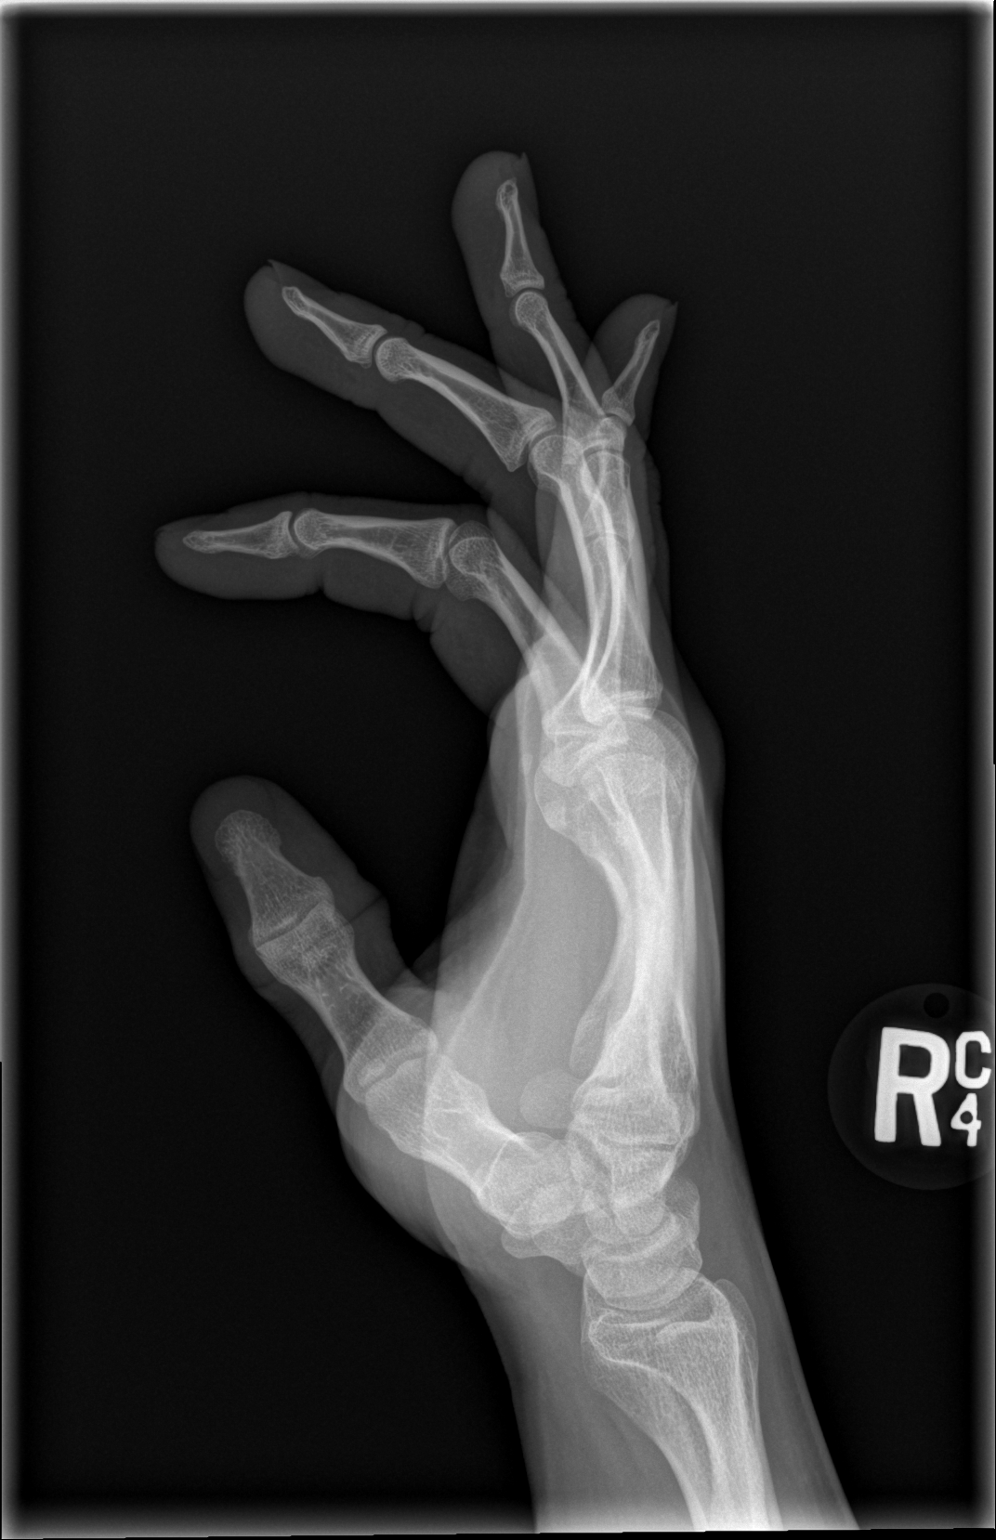

[3 of 3 positions shown; findings below may reference images not displayed]

FINDINGS: There is a fracture through the mid shaft of the right fifth
metacarpal with slight distraction and apex ulnar angulation. There
is adjacent soft tissue swelling. No additional fracture or
malalignment.
IMPRESSION: Acute fracture of the midshaft of the right fifth metacarpal.

## 2020-02-24 IMAGING — RF DG C-ARM 1-60 MIN
1 series · 3 of 3 positions shown · non-contrast
Comparison: Radiographs 10/22/2019

CLINICAL DATA: ORIF right fifth metacarpal

EXAM:
RIGHT HAND - COMPLETE 3+ VIEW; DG C-ARM 1-60 MIN

[Series 1: run · 3 of 3 slices shown]
[im 1/3]
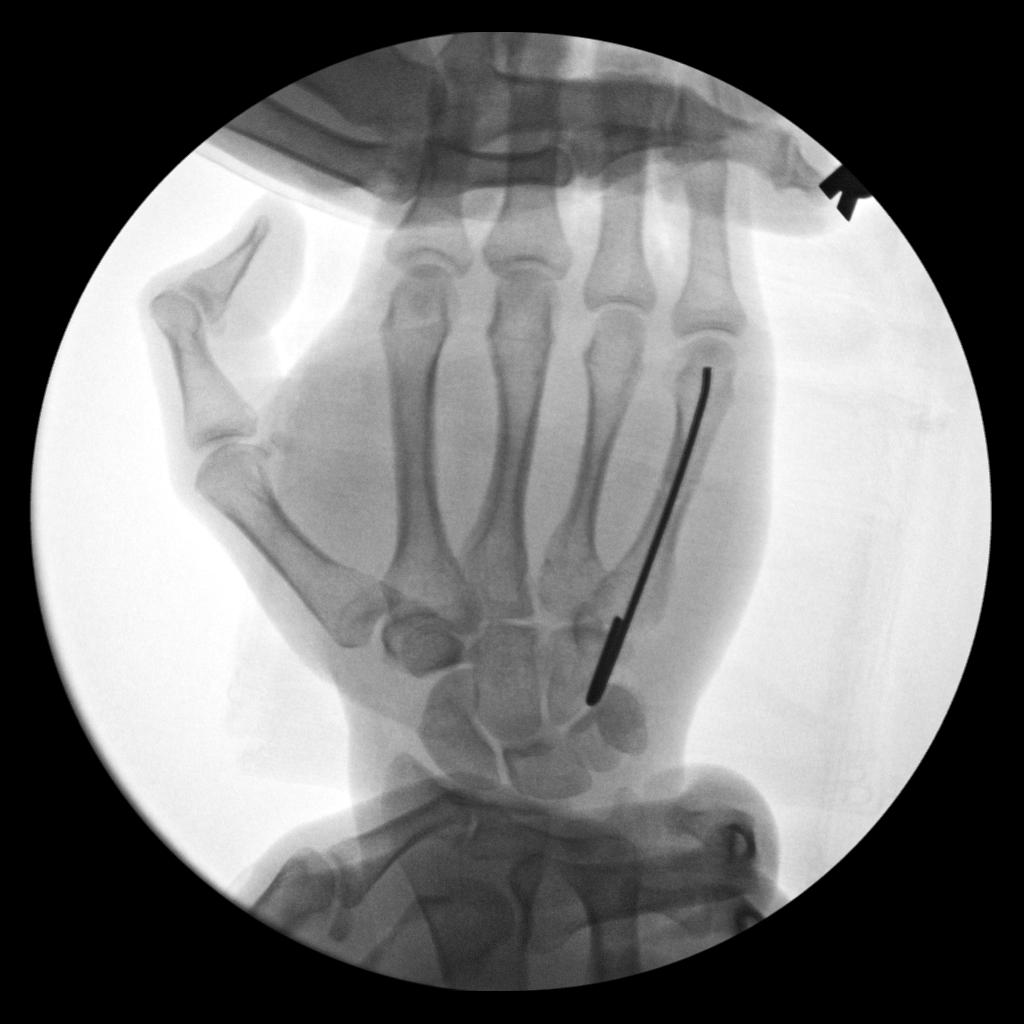
[im 2/3]
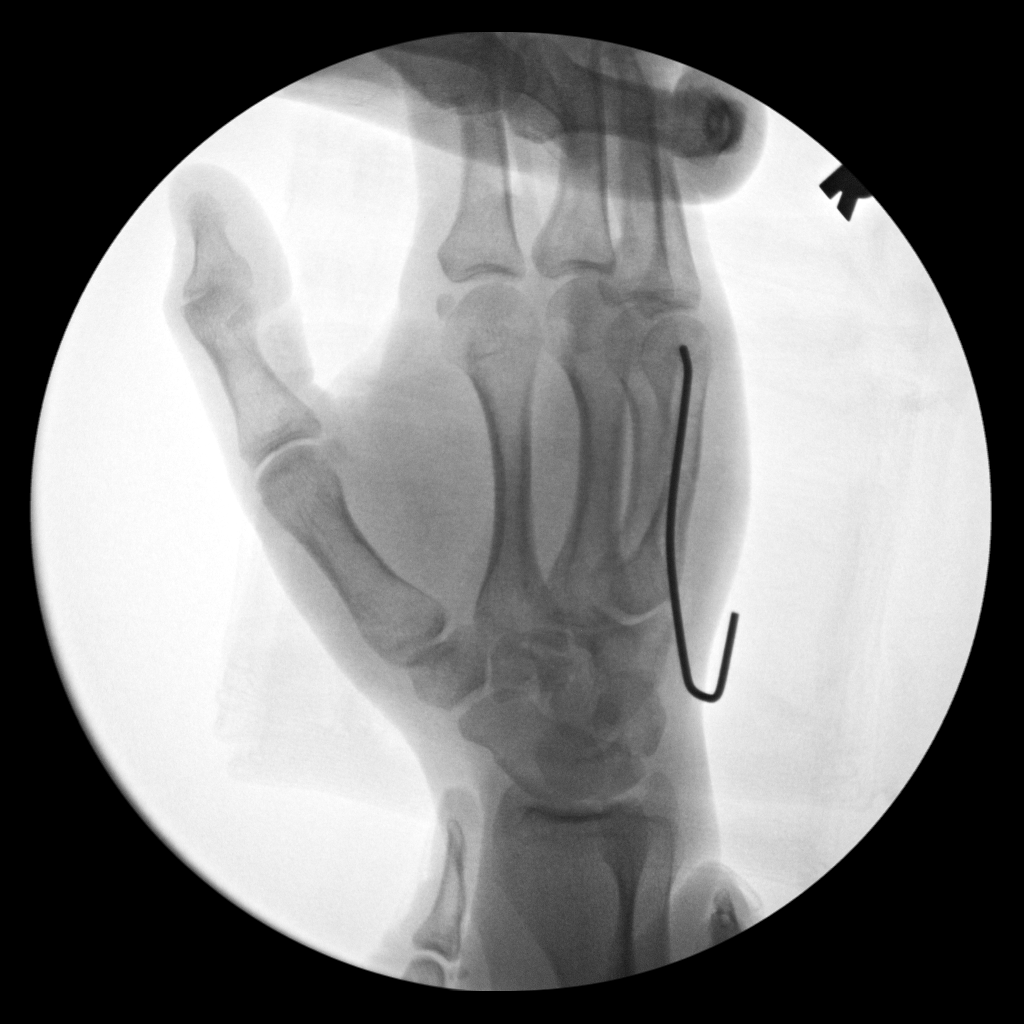
[im 3/3]
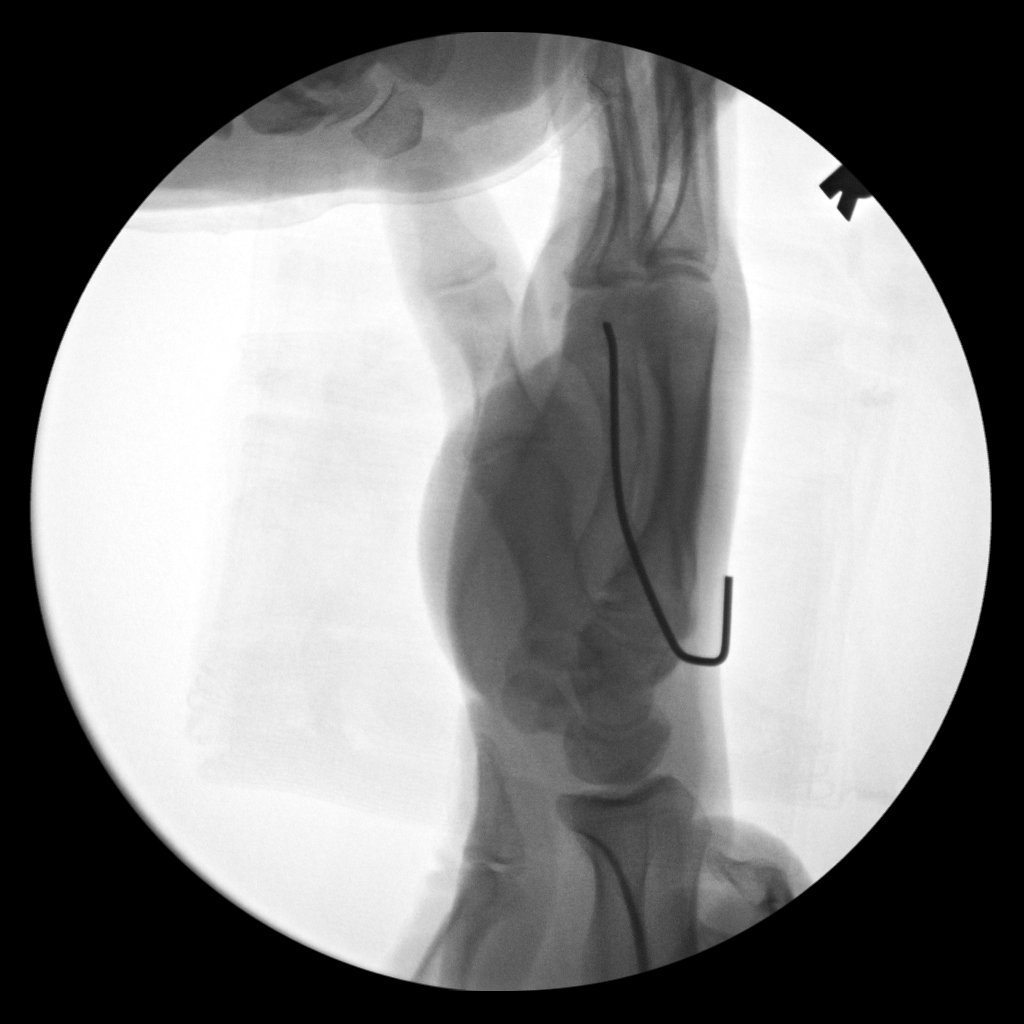

[3 of 3 positions shown; findings below may reference images not displayed]

FINDINGS: Intraoperative fluoroscopic images demonstrate plate placement of a
metallic Recalde wire transfixing the dorsal apically angulated
fracture of the fifth metacarpal diaphysis, now with improved, near
anatomic alignment post reduction. No additional fractures are seen.
Soft tissues are unremarkable aside from expected postsurgical
changes.
IMPRESSION: ORIF of the fifth metacarpal fracture, now with improved, near
anatomic alignment post reduction.

## 2020-02-28 DIAGNOSIS — F909 Attention-deficit hyperactivity disorder, unspecified type: Secondary | ICD-10-CM | POA: Diagnosis not present

## 2020-03-08 DIAGNOSIS — J3089 Other allergic rhinitis: Secondary | ICD-10-CM | POA: Diagnosis not present

## 2020-03-08 DIAGNOSIS — J3081 Allergic rhinitis due to animal (cat) (dog) hair and dander: Secondary | ICD-10-CM | POA: Diagnosis not present

## 2020-03-08 DIAGNOSIS — J301 Allergic rhinitis due to pollen: Secondary | ICD-10-CM | POA: Diagnosis not present

## 2020-03-08 DIAGNOSIS — R05 Cough: Secondary | ICD-10-CM | POA: Diagnosis not present

## 2020-03-23 DIAGNOSIS — F4011 Social phobia, generalized: Secondary | ICD-10-CM | POA: Diagnosis not present

## 2020-03-23 DIAGNOSIS — F900 Attention-deficit hyperactivity disorder, predominantly inattentive type: Secondary | ICD-10-CM | POA: Diagnosis not present

## 2020-03-27 DIAGNOSIS — F909 Attention-deficit hyperactivity disorder, unspecified type: Secondary | ICD-10-CM | POA: Diagnosis not present

## 2020-04-10 DIAGNOSIS — F909 Attention-deficit hyperactivity disorder, unspecified type: Secondary | ICD-10-CM | POA: Diagnosis not present

## 2020-04-24 DIAGNOSIS — F909 Attention-deficit hyperactivity disorder, unspecified type: Secondary | ICD-10-CM | POA: Diagnosis not present

## 2020-05-08 DIAGNOSIS — F909 Attention-deficit hyperactivity disorder, unspecified type: Secondary | ICD-10-CM | POA: Diagnosis not present

## 2020-06-05 DIAGNOSIS — F909 Attention-deficit hyperactivity disorder, unspecified type: Secondary | ICD-10-CM | POA: Diagnosis not present

## 2020-07-03 DIAGNOSIS — F909 Attention-deficit hyperactivity disorder, unspecified type: Secondary | ICD-10-CM | POA: Diagnosis not present

## 2020-07-13 DIAGNOSIS — F900 Attention-deficit hyperactivity disorder, predominantly inattentive type: Secondary | ICD-10-CM | POA: Diagnosis not present

## 2020-07-13 DIAGNOSIS — F4011 Social phobia, generalized: Secondary | ICD-10-CM | POA: Diagnosis not present

## 2020-07-31 DIAGNOSIS — F909 Attention-deficit hyperactivity disorder, unspecified type: Secondary | ICD-10-CM | POA: Diagnosis not present

## 2020-08-10 DIAGNOSIS — F909 Attention-deficit hyperactivity disorder, unspecified type: Secondary | ICD-10-CM | POA: Diagnosis not present

## 2020-08-24 DIAGNOSIS — F909 Attention-deficit hyperactivity disorder, unspecified type: Secondary | ICD-10-CM | POA: Diagnosis not present

## 2020-09-07 DIAGNOSIS — F909 Attention-deficit hyperactivity disorder, unspecified type: Secondary | ICD-10-CM | POA: Diagnosis not present

## 2020-09-21 DIAGNOSIS — F909 Attention-deficit hyperactivity disorder, unspecified type: Secondary | ICD-10-CM | POA: Diagnosis not present

## 2020-10-05 DIAGNOSIS — F909 Attention-deficit hyperactivity disorder, unspecified type: Secondary | ICD-10-CM | POA: Diagnosis not present

## 2020-10-09 DIAGNOSIS — F900 Attention-deficit hyperactivity disorder, predominantly inattentive type: Secondary | ICD-10-CM | POA: Diagnosis not present

## 2020-10-09 DIAGNOSIS — F4011 Social phobia, generalized: Secondary | ICD-10-CM | POA: Diagnosis not present

## 2020-10-19 DIAGNOSIS — F909 Attention-deficit hyperactivity disorder, unspecified type: Secondary | ICD-10-CM | POA: Diagnosis not present

## 2020-11-02 DIAGNOSIS — F909 Attention-deficit hyperactivity disorder, unspecified type: Secondary | ICD-10-CM | POA: Diagnosis not present

## 2020-11-30 DIAGNOSIS — F909 Attention-deficit hyperactivity disorder, unspecified type: Secondary | ICD-10-CM | POA: Diagnosis not present

## 2020-12-28 DIAGNOSIS — F909 Attention-deficit hyperactivity disorder, unspecified type: Secondary | ICD-10-CM | POA: Diagnosis not present

## 2021-01-01 DIAGNOSIS — F4011 Social phobia, generalized: Secondary | ICD-10-CM | POA: Diagnosis not present

## 2021-01-01 DIAGNOSIS — F900 Attention-deficit hyperactivity disorder, predominantly inattentive type: Secondary | ICD-10-CM | POA: Diagnosis not present

## 2021-01-11 DIAGNOSIS — F909 Attention-deficit hyperactivity disorder, unspecified type: Secondary | ICD-10-CM | POA: Diagnosis not present

## 2021-01-25 DIAGNOSIS — F909 Attention-deficit hyperactivity disorder, unspecified type: Secondary | ICD-10-CM | POA: Diagnosis not present

## 2021-02-08 DIAGNOSIS — F909 Attention-deficit hyperactivity disorder, unspecified type: Secondary | ICD-10-CM | POA: Diagnosis not present

## 2021-02-22 DIAGNOSIS — F909 Attention-deficit hyperactivity disorder, unspecified type: Secondary | ICD-10-CM | POA: Diagnosis not present

## 2021-03-08 DIAGNOSIS — F909 Attention-deficit hyperactivity disorder, unspecified type: Secondary | ICD-10-CM | POA: Diagnosis not present

## 2021-03-12 DIAGNOSIS — J301 Allergic rhinitis due to pollen: Secondary | ICD-10-CM | POA: Diagnosis not present

## 2021-03-12 DIAGNOSIS — J3089 Other allergic rhinitis: Secondary | ICD-10-CM | POA: Diagnosis not present

## 2021-03-12 DIAGNOSIS — J3081 Allergic rhinitis due to animal (cat) (dog) hair and dander: Secondary | ICD-10-CM | POA: Diagnosis not present

## 2021-03-12 DIAGNOSIS — J45991 Cough variant asthma: Secondary | ICD-10-CM | POA: Diagnosis not present

## 2021-03-22 DIAGNOSIS — F909 Attention-deficit hyperactivity disorder, unspecified type: Secondary | ICD-10-CM | POA: Diagnosis not present

## 2021-03-28 DIAGNOSIS — F900 Attention-deficit hyperactivity disorder, predominantly inattentive type: Secondary | ICD-10-CM | POA: Diagnosis not present

## 2021-03-28 DIAGNOSIS — F4011 Social phobia, generalized: Secondary | ICD-10-CM | POA: Diagnosis not present

## 2021-04-05 DIAGNOSIS — F909 Attention-deficit hyperactivity disorder, unspecified type: Secondary | ICD-10-CM | POA: Diagnosis not present

## 2021-04-19 DIAGNOSIS — F909 Attention-deficit hyperactivity disorder, unspecified type: Secondary | ICD-10-CM | POA: Diagnosis not present

## 2021-05-03 DIAGNOSIS — F909 Attention-deficit hyperactivity disorder, unspecified type: Secondary | ICD-10-CM | POA: Diagnosis not present

## 2021-05-31 DIAGNOSIS — F909 Attention-deficit hyperactivity disorder, unspecified type: Secondary | ICD-10-CM | POA: Diagnosis not present

## 2021-06-14 DIAGNOSIS — F909 Attention-deficit hyperactivity disorder, unspecified type: Secondary | ICD-10-CM | POA: Diagnosis not present

## 2021-06-20 DIAGNOSIS — F4011 Social phobia, generalized: Secondary | ICD-10-CM | POA: Diagnosis not present

## 2021-06-20 DIAGNOSIS — F900 Attention-deficit hyperactivity disorder, predominantly inattentive type: Secondary | ICD-10-CM | POA: Diagnosis not present

## 2021-07-26 DIAGNOSIS — F909 Attention-deficit hyperactivity disorder, unspecified type: Secondary | ICD-10-CM | POA: Diagnosis not present

## 2021-08-09 DIAGNOSIS — F909 Attention-deficit hyperactivity disorder, unspecified type: Secondary | ICD-10-CM | POA: Diagnosis not present

## 2021-08-23 DIAGNOSIS — F909 Attention-deficit hyperactivity disorder, unspecified type: Secondary | ICD-10-CM | POA: Diagnosis not present

## 2021-09-06 DIAGNOSIS — F909 Attention-deficit hyperactivity disorder, unspecified type: Secondary | ICD-10-CM | POA: Diagnosis not present

## 2021-09-11 DIAGNOSIS — F900 Attention-deficit hyperactivity disorder, predominantly inattentive type: Secondary | ICD-10-CM | POA: Diagnosis not present

## 2021-09-11 DIAGNOSIS — F4011 Social phobia, generalized: Secondary | ICD-10-CM | POA: Diagnosis not present

## 2021-09-20 DIAGNOSIS — F909 Attention-deficit hyperactivity disorder, unspecified type: Secondary | ICD-10-CM | POA: Diagnosis not present

## 2021-10-04 DIAGNOSIS — F909 Attention-deficit hyperactivity disorder, unspecified type: Secondary | ICD-10-CM | POA: Diagnosis not present

## 2021-10-18 DIAGNOSIS — F909 Attention-deficit hyperactivity disorder, unspecified type: Secondary | ICD-10-CM | POA: Diagnosis not present

## 2021-11-29 DIAGNOSIS — F909 Attention-deficit hyperactivity disorder, unspecified type: Secondary | ICD-10-CM | POA: Diagnosis not present

## 2021-12-10 DIAGNOSIS — F900 Attention-deficit hyperactivity disorder, predominantly inattentive type: Secondary | ICD-10-CM | POA: Diagnosis not present

## 2021-12-10 DIAGNOSIS — M549 Dorsalgia, unspecified: Secondary | ICD-10-CM | POA: Diagnosis not present

## 2021-12-10 DIAGNOSIS — M62838 Other muscle spasm: Secondary | ICD-10-CM | POA: Diagnosis not present

## 2021-12-10 DIAGNOSIS — F4011 Social phobia, generalized: Secondary | ICD-10-CM | POA: Diagnosis not present

## 2022-01-10 DIAGNOSIS — F909 Attention-deficit hyperactivity disorder, unspecified type: Secondary | ICD-10-CM | POA: Diagnosis not present

## 2022-01-24 DIAGNOSIS — F909 Attention-deficit hyperactivity disorder, unspecified type: Secondary | ICD-10-CM | POA: Diagnosis not present

## 2022-02-07 DIAGNOSIS — F909 Attention-deficit hyperactivity disorder, unspecified type: Secondary | ICD-10-CM | POA: Diagnosis not present

## 2022-02-21 DIAGNOSIS — F909 Attention-deficit hyperactivity disorder, unspecified type: Secondary | ICD-10-CM | POA: Diagnosis not present

## 2022-03-07 DIAGNOSIS — F909 Attention-deficit hyperactivity disorder, unspecified type: Secondary | ICD-10-CM | POA: Diagnosis not present

## 2022-03-08 DIAGNOSIS — F4011 Social phobia, generalized: Secondary | ICD-10-CM | POA: Diagnosis not present

## 2022-03-08 DIAGNOSIS — F900 Attention-deficit hyperactivity disorder, predominantly inattentive type: Secondary | ICD-10-CM | POA: Diagnosis not present

## 2022-03-11 DIAGNOSIS — J45991 Cough variant asthma: Secondary | ICD-10-CM | POA: Diagnosis not present

## 2022-03-11 DIAGNOSIS — J3081 Allergic rhinitis due to animal (cat) (dog) hair and dander: Secondary | ICD-10-CM | POA: Diagnosis not present

## 2022-03-11 DIAGNOSIS — J301 Allergic rhinitis due to pollen: Secondary | ICD-10-CM | POA: Diagnosis not present

## 2022-03-11 DIAGNOSIS — J3089 Other allergic rhinitis: Secondary | ICD-10-CM | POA: Diagnosis not present

## 2022-03-21 DIAGNOSIS — F909 Attention-deficit hyperactivity disorder, unspecified type: Secondary | ICD-10-CM | POA: Diagnosis not present

## 2022-04-18 DIAGNOSIS — F909 Attention-deficit hyperactivity disorder, unspecified type: Secondary | ICD-10-CM | POA: Diagnosis not present

## 2023-07-28 ENCOUNTER — Encounter (HOSPITAL_COMMUNITY): Payer: Self-pay

## 2023-07-28 ENCOUNTER — Other Ambulatory Visit: Payer: Self-pay

## 2023-07-28 ENCOUNTER — Emergency Department (HOSPITAL_COMMUNITY)
Admission: EM | Admit: 2023-07-28 | Discharge: 2023-07-28 | Disposition: A | Discharge status: Home / Self Care | Payer: Commercial Managed Care - PPO | Attending: Emergency Medicine | Admitting: Emergency Medicine

## 2023-07-28 DIAGNOSIS — F419 Anxiety disorder, unspecified: Secondary | ICD-10-CM | POA: Insufficient documentation

## 2023-07-28 DIAGNOSIS — F410 Panic disorder [episodic paroxysmal anxiety] without agoraphobia: Secondary | ICD-10-CM | POA: Diagnosis present

## 2023-07-28 DIAGNOSIS — R002 Palpitations: Secondary | ICD-10-CM | POA: Insufficient documentation

## 2023-07-28 HISTORY — DX: Anxiety disorder, unspecified: F41.9

## 2023-07-28 MED ORDER — ALPRAZOLAM 0.25 MG PO TABS
0.5000 mg | ORAL_TABLET | Freq: Once | ORAL | Status: AC
  Administered 2023-07-28: 0.5 mg via ORAL
  Filled 2023-07-28: qty 2

## 2023-07-28 NOTE — ED Triage Notes (Signed)
Pt states that he was playing video games and suddenly began feeling SOB, pt has hx of asthma and anxiety

## 2023-07-28 NOTE — ED Provider Notes (Signed)
Emergency Department Provider Note   I have reviewed the triage vital signs and the nursing notes.   HISTORY  Chief Complaint Panic Attack   HPI Jeffrey Richardson is a 27 y.o. male with past history of ADHD and anxiety presents to the emergency department with acute onset palpitations and shortness of breath.  He states he was having a stressful conversation on the phone with his girlfriend and symptoms began just after hanging up.  He has had anxiety and anxiety attacks in the past and states this feels similar.  He is not experiencing any chest pain.  He intermittently takes his BuSpar but is not as consistent as he should be.  Denies any drugs or alcohol.  Past Medical History:  Diagnosis Date   ADHD (attention deficit hyperactivity disorder)    Anxiety     Review of Systems  Constitutional: No fever/chills Cardiovascular: Denies chest pain. Positive palpitations.  Respiratory: Positive shortness of breath. Gastrointestinal: No abdominal pain.  No nausea, no vomiting.  No diarrhea.  No constipation. Genitourinary: Negative for dysuria. Musculoskeletal: Negative for back pain. Skin: Negative for rash. Neurological: Negative for headaches, focal weakness or numbness.  ____________________________________________   PHYSICAL EXAM:  VITAL SIGNS: ED Triage Vitals  Encounter Vitals Group     BP 07/28/23 0412 (!) 150/96     Pulse Rate 07/28/23 0413 (!) 112     Resp 07/28/23 0412 (!) 24     Temp 07/28/23 0412 97.7 F (36.5 C)     Temp src --      SpO2 07/28/23 0412 100 %   Constitutional: Alert and oriented. Well appearing and in no acute distress. Eyes: Conjunctivae are normal.  Head: Atraumatic. Nose: No congestion/rhinnorhea. Mouth/Throat: Mucous membranes are moist.   Neck: No stridor.   Cardiovascular: Normal rate, regular rhythm. Good peripheral circulation. Grossly normal heart sounds.   Respiratory: Normal respiratory effort.  No retractions. Lungs  CTAB. Gastrointestinal: Soft and nontender. No distention.  Musculoskeletal: No lower extremity tenderness nor edema. No gross deformities of extremities. Neurologic:  Normal speech and language. No gross focal neurologic deficits are appreciated.  Skin:  Skin is warm, dry and intact. No rash noted.  ____________________________________________  EKG   EKG Interpretation Date/Time:  Monday July 28 2023 04:57:15 EDT Ventricular Rate:  63 PR Interval:  133 QRS Duration:  96 QT Interval:  378 QTC Calculation: 387 R Axis:   63  Text Interpretation: Sinus rhythm Confirmed by Alona Bene [phone]) on 07/28/2023 5:12:17 AM        ____________________________________________   PROCEDURES  Procedure(s) performed:   Procedures  None  ____________________________________________   INITIAL IMPRESSION / ASSESSMENT AND PLAN / ED COURSE  Pertinent labs & imaging results that were available during my care of the patient were reviewed by me and considered in my medical decision making (see chart for details).   This patient is Presenting for Evaluation of palpitations, which does require a range of treatment options, and is a complaint that involves a high risk of morbidity and mortality.  The Differential Diagnoses includes but is not exclusive to acute coronary syndrome, aortic dissection, pulmonary embolism, cardiac tamponade, community-acquired pneumonia, pericarditis, musculoskeletal chest wall pain, etc.   Critical Interventions-    Medications  ALPRAZolam (XANAX) tablet 0.5 mg (0.5 mg Oral Given 07/28/23 0447)    Reassessment after intervention: symptoms improved.   Medical Decision Making: Summary:  Patient presents the emergency department with palpitations and shortness of breath.  No chest pain.  Patient with prior history of anxiety and notes this feels similar.  Considered lab imaging including troponin and chest x-ray but plan for EKG, Xanax, reassess.  Reevaluation  with update and discussion with patient.  He is feeling much better after treatment in the emergency department.  His symptoms have resolved.  His EKG is reassuring.  Defer further workup for now.    Patient's presentation is most consistent with acute presentation with potential threat to life or bodily function.   Disposition: discharge  ____________________________________________  FINAL CLINICAL IMPRESSION(S) / ED DIAGNOSES  Final diagnoses:  Anxiety    Note:  This document was prepared using Dragon voice recognition software and may include unintentional dictation errors.  Alona Bene, MD, [facility]    Lylah Lantis, Arlyss Repress, MD 07/31/23 [phone]

## 2023-07-28 NOTE — Discharge Instructions (Signed)
Please take your home medications. Return with any new or suddenly worsening symptoms.

## 2024-04-12 ENCOUNTER — Other Ambulatory Visit: Payer: Self-pay

## 2024-04-12 ENCOUNTER — Encounter (HOSPITAL_BASED_OUTPATIENT_CLINIC_OR_DEPARTMENT_OTHER): Payer: Self-pay

## 2024-04-12 DIAGNOSIS — E876 Hypokalemia: Secondary | ICD-10-CM | POA: Insufficient documentation

## 2024-04-12 DIAGNOSIS — J45909 Unspecified asthma, uncomplicated: Secondary | ICD-10-CM | POA: Diagnosis not present

## 2024-04-12 DIAGNOSIS — R42 Dizziness and giddiness: Secondary | ICD-10-CM | POA: Insufficient documentation

## 2024-04-12 DIAGNOSIS — M5432 Sciatica, left side: Secondary | ICD-10-CM | POA: Diagnosis not present

## 2024-04-12 DIAGNOSIS — Z7951 Long term (current) use of inhaled steroids: Secondary | ICD-10-CM | POA: Diagnosis not present

## 2024-04-12 NOTE — ED Triage Notes (Signed)
 Pt reports dizziness x 3 days. L leg numb since 8:30pm. Decreased sensation noted on left leg. No unilateral weakness. No facial droop, confusion, or slurred speech. Pt states he had allergy skin testing on Thursday. Mild SOB reported. Hx of asthma. No acute respiratory distress noted. Denies CP. VAN (-)

## 2024-04-13 ENCOUNTER — Emergency Department (HOSPITAL_BASED_OUTPATIENT_CLINIC_OR_DEPARTMENT_OTHER)
Admission: EM | Admit: 2024-04-13 | Discharge: 2024-04-13 | Disposition: A | Discharge status: Home / Self Care | Attending: Emergency Medicine | Admitting: Emergency Medicine

## 2024-04-13 DIAGNOSIS — M5432 Sciatica, left side: Secondary | ICD-10-CM

## 2024-04-13 DIAGNOSIS — E876 Hypokalemia: Secondary | ICD-10-CM

## 2024-04-13 LAB — BASIC METABOLIC PANEL WITH GFR
Anion gap: 7 (ref 5–15)
BUN: 17 mg/dL (ref 6–20)
CO2: 25 mmol/L (ref 22–32)
Calcium: 9.6 mg/dL (ref 8.9–10.3)
Chloride: 107 mmol/L (ref 98–111)
Creatinine, Ser: 0.87 mg/dL (ref 0.61–1.24)
GFR, Estimated: >60 mL/min (ref >60)
Glucose, Bld: 79 mg/dL (ref 70–99)
Potassium: 3.3 mmol/L — ABNORMAL LOW (ref 3.5–5.1)
Sodium: 139 mmol/L (ref 135–145)

## 2024-04-13 LAB — CBC
HCT: 48.6 % (ref 39.0–52.0)
Hemoglobin: 16.7 g/dL (ref 13.0–17.0)
MCH: 28 pg (ref 26.0–34.0)
MCHC: 34.4 g/dL (ref 30.0–36.0)
MCV: 81.5 fL (ref 80.0–100.0)
Platelets: 305 10*3/uL (ref 150–400)
RBC: 5.96 MIL/uL — ABNORMAL HIGH (ref 4.22–5.81)
RDW: 12.9 % (ref 11.5–15.5)
WBC: 10 10*3/uL (ref 4.0–10.5)
nRBC: 0 % (ref 0.0–0.2)

## 2024-04-13 LAB — CBG MONITORING, ED: Glucose-Capillary: 74 mg/dL (ref 70–99)

## 2024-04-13 MED ORDER — IBUPROFEN 800 MG PO TABS: 800.0000 mg | ORAL_TABLET | Freq: Four times a day (QID) | ORAL | 0 refills | Status: AC | PRN

## 2024-04-13 MED ORDER — POTASSIUM CHLORIDE CRYS ER 20 MEQ PO TBCR
40.0000 meq | EXTENDED_RELEASE_TABLET | Freq: Once | ORAL | Status: AC
  Administered 2024-04-13: 40 meq via ORAL
  Filled 2024-04-13: qty 2

## 2024-04-13 NOTE — ED Provider Notes (Signed)
 [facility] Provider Note   CSN: [id number] Arrival date & time: 04/12/24  2334     History  Chief Complaint  Patient presents with   Dizziness   Numbness    Jeffrey Richardson is a 28 y.o. male.  Patient presents with several complaints.  Patient reports that he has been under a lot of stress recently related to work, relationships (family and girlfriend).  He reports a history of anxiety and panic attacks.  Symptoms currently not consistent with panic attack.  He reports that he feels dizzy and weak, ongoing for couple of weeks.  Tonight, however, he developed pain in the left thigh going down his leg and associated numbness of the shin area.  No known injury.  No change in bowel or bladder function.  No flank pain.       Home Medications Prior to Admission medications   Medication Sig Start Date End Date Taking? Authorizing Provider  ibuprofen  (ADVIL ) 800 MG tablet Take 1 tablet (800 mg total) by mouth every 6 (six) hours as needed for moderate pain (pain score 4-6). 04/13/24  Yes Chalene Treu, Marine Sia, MD  acetaminophen  (TYLENOL ) 325 MG tablet Take 2 tablets (650 mg total) by mouth every 6 (six) hours. 10/22/19   Rober Chimera, MD  albuterol (PROVENTIL HFA) 108 (90 Base) MCG/ACT inhaler Inhale 2 puffs into the lungs every 6 (six) hours as needed for wheezing or shortness of breath (from allergy-induced asthma).     [provider]  dextroamphetamine  (DEXTROSTAT ) 5 MG tablet Take 1 tablet (5 mg total) by mouth 2 (two) times daily with breakfast and lunch. Patient not taking: Reported on 10/22/2019 05/21/16   Arfeen, Bronson Canny, MD  EPINEPHrine  0.3 mg/0.3 mL IJ SOAJ injection Inject 0.3 mg into the muscle once as needed for anaphylaxis.  04/04/16   [provider]  fluticasone (FLONASE) 50 MCG/ACT nasal spray Place 2 sprays into both nostrils daily as needed (for seasonal allergies).  02/07/15   [provider]   levocetirizine (XYZAL) 5 MG tablet Take 5 mg by mouth at bedtime.  04/03/15   [provider]  montelukast (SINGULAIR) 10 MG tablet Take 10 mg by mouth at bedtime.  04/03/15   [provider]  oxyCODONE  (ROXICODONE ) 5 MG immediate release tablet Take 1 tablet (5 mg total) by mouth every 6 (six) hours as needed (severe postop pain). 10/22/19   Rober Chimera, MD      Allergies    Patient has no known allergies.    Review of Systems   Review of Systems  Physical Exam Updated Vital Signs BP (!) 133/93   Pulse 86   Temp 98.1 F (36.7 C) (Oral)   Resp 20   Ht 5' 6.5" (1.689 m)   Wt 68.9 kg   SpO2 99%   BMI 24.17 kg/m  Physical Exam Vitals and nursing note reviewed.  Constitutional:      General: He is not in acute distress.    Appearance: He is well-developed.  HENT:     Head: Normocephalic and atraumatic.     Mouth/Throat:     Mouth: Mucous membranes are moist.  Eyes:     General: Vision grossly intact. Gaze aligned appropriately.     Extraocular Movements: Extraocular movements intact.     Conjunctiva/sclera: Conjunctivae normal.  Cardiovascular:     Rate and Rhythm: Normal rate and regular rhythm.     Pulses: Normal pulses.     Heart  sounds: Normal heart sounds, S1 normal and S2 normal. No murmur heard.    No friction rub. No gallop.  Pulmonary:     Effort: Pulmonary effort is normal. No respiratory distress.     Breath sounds: Normal breath sounds.  Abdominal:     Palpations: Abdomen is soft.     Tenderness: There is no abdominal tenderness. There is no guarding or rebound.     Hernia: No hernia is present.  Musculoskeletal:        General: No swelling.     Cervical back: Full passive range of motion without pain, normal range of motion and neck supple. No pain with movement, spinous process tenderness or muscular tenderness. Normal range of motion.     Right lower leg: No edema.     Left lower leg: No edema.  Skin:    General: Skin is warm and  dry.     Capillary Refill: Capillary refill takes less than 2 seconds.     Findings: No ecchymosis, erythema, lesion or wound.  Neurological:     Mental Status: He is alert and oriented to person, place, and time.     GCS: GCS eye subscore is 4. GCS verbal subscore is 5. GCS motor subscore is 6.     Cranial Nerves: Cranial nerves 2-12 are intact.     Sensory: Sensation is intact.     Motor: Motor function is intact. No weakness or abnormal muscle tone.     Coordination: Coordination is intact.  Psychiatric:        Mood and Affect: Mood normal.        Speech: Speech normal.        Behavior: Behavior normal.     ED Results / Procedures / Treatments   Labs (all labs ordered are listed, but only abnormal results are displayed) Labs Reviewed  BASIC METABOLIC PANEL WITH GFR - Abnormal; Notable for the following components:      Result Value   Potassium 3.3 (*)    All other components within normal limits  CBC - Abnormal; Notable for the following components:   RBC 5.96 (*)    All other components within normal limits  URINALYSIS, ROUTINE W REFLEX MICROSCOPIC  CBG MONITORING, ED    EKG EKG Interpretation Date/Time:  Monday April 12 2024 23:51:25 EDT Ventricular Rate:  90 PR Interval:  130 QRS Duration:  90 QT Interval:  368 QTC Calculation: 450 R Axis:   72  Text Interpretation: Normal sinus rhythm Normal ECG When compared with ECG of 28-Jul-2023 04:57, PREVIOUS ECG IS PRESENT Confirmed by Ballard Bongo [phone]) on 04/13/2024 1:33:39 AM  Radiology No results found.  Procedures Procedures    Medications Ordered in ED Medications  potassium chloride  SA (KLOR-CON  M) CR tablet 40 mEq (40 mEq Oral Given 04/13/24 0145)    ED Course/ Medical Decision Making/ A&P                                 Medical Decision Making Amount and/or Complexity of Data Reviewed Labs: ordered.  Risk Prescription drug management.   Presents with complaints of dizziness that has  been ongoing for days.  Patient without focal neurologic finding on exam.  Symptoms are nonvertiginous.  He does not appear to be experiencing a significant central nervous system problem, does not require imaging at this time.  Patient's lab work was unremarkable other than mild hypokalemia, given potassium.  Complaining of pain in the left leg with numbness.  He has normal strength and sensation, no foot drop.  No saddle anesthesia.  No red flags.  Since the area is experiencing pain, no concern for stroke as a cause of his symptoms.  He has no independent stroke risk factors.        Final Clinical Impression(s) / ED Diagnoses Final diagnoses:  Sciatica of left side  Hypokalemia    Rx / DC Orders ED Discharge Orders          Ordered    ibuprofen  (ADVIL ) 800 MG tablet  Every 6 hours PRN        04/13/24 0153              Ballard Bongo, MD 04/13/24 [phone]

## 2024-05-10 ENCOUNTER — Emergency Department (HOSPITAL_COMMUNITY)

## 2024-05-10 ENCOUNTER — Emergency Department (HOSPITAL_BASED_OUTPATIENT_CLINIC_OR_DEPARTMENT_OTHER)

## 2024-05-10 ENCOUNTER — Emergency Department (HOSPITAL_BASED_OUTPATIENT_CLINIC_OR_DEPARTMENT_OTHER)
Admission: EM | Admit: 2024-05-10 | Discharge: 2024-05-11 | Disposition: A | Discharge status: Home / Self Care | Attending: Emergency Medicine | Admitting: Emergency Medicine

## 2024-05-10 ENCOUNTER — Encounter (HOSPITAL_BASED_OUTPATIENT_CLINIC_OR_DEPARTMENT_OTHER): Payer: Self-pay | Admitting: Emergency Medicine

## 2024-05-10 DIAGNOSIS — R2990 Unspecified symptoms and signs involving the nervous system: Secondary | ICD-10-CM

## 2024-05-10 DIAGNOSIS — R202 Paresthesia of skin: Secondary | ICD-10-CM | POA: Insufficient documentation

## 2024-05-10 DIAGNOSIS — R200 Anesthesia of skin: Secondary | ICD-10-CM | POA: Diagnosis not present

## 2024-05-10 DIAGNOSIS — F410 Panic disorder [episodic paroxysmal anxiety] without agoraphobia: Secondary | ICD-10-CM | POA: Insufficient documentation

## 2024-05-10 DIAGNOSIS — R519 Headache, unspecified: Secondary | ICD-10-CM | POA: Insufficient documentation

## 2024-05-10 DIAGNOSIS — R479 Unspecified speech disturbances: Secondary | ICD-10-CM | POA: Diagnosis not present

## 2024-05-10 DIAGNOSIS — F419 Anxiety disorder, unspecified: Secondary | ICD-10-CM

## 2024-05-10 DIAGNOSIS — R42 Dizziness and giddiness: Secondary | ICD-10-CM | POA: Insufficient documentation

## 2024-05-10 DIAGNOSIS — J45909 Unspecified asthma, uncomplicated: Secondary | ICD-10-CM | POA: Diagnosis not present

## 2024-05-10 LAB — ETHANOL: Alcohol, Ethyl (B): <15 mg/dL (ref <15)

## 2024-05-10 LAB — COMPREHENSIVE METABOLIC PANEL WITH GFR
ALT: 32 U/L (ref 0–44)
AST: 27 U/L (ref 15–41)
Albumin: 4.8 g/dL (ref 3.5–5.0)
Alkaline Phosphatase: 54 U/L (ref 38–126)
Anion gap: 13 (ref 5–15)
BUN: 13 mg/dL (ref 6–20)
CO2: 25 mmol/L (ref 22–32)
Calcium: 10.2 mg/dL (ref 8.9–10.3)
Chloride: 103 mmol/L (ref 98–111)
Creatinine, Ser: 0.95 mg/dL (ref 0.61–1.24)
GFR, Estimated: >60 mL/min (ref >60)
Glucose, Bld: 109 mg/dL — ABNORMAL HIGH (ref 70–99)
Potassium: 3.7 mmol/L (ref 3.5–5.1)
Sodium: 141 mmol/L (ref 135–145)
Total Bilirubin: 0.2 mg/dL (ref 0.0–1.2)
Total Protein: 7.3 g/dL (ref 6.5–8.1)

## 2024-05-10 LAB — PROTIME-INR
INR: 0.9 (ref 0.8–1.2)
Prothrombin Time: 12.3 s (ref 11.4–15.2)

## 2024-05-10 LAB — DIFFERENTIAL
Abs Immature Granulocytes: 0.02 10*3/uL (ref 0.00–0.07)
Basophils Absolute: 0 10*3/uL (ref 0.0–0.1)
Basophils Relative: 0 %
Eosinophils Absolute: 0.3 10*3/uL (ref 0.0–0.5)
Eosinophils Relative: 4 %
Immature Granulocytes: 0 %
Lymphocytes Relative: 32 %
Lymphs Abs: 3.1 10*3/uL (ref 0.7–4.0)
Monocytes Absolute: 0.7 10*3/uL (ref 0.1–1.0)
Monocytes Relative: 7 %
Neutro Abs: 5.4 10*3/uL (ref 1.7–7.7)
Neutrophils Relative %: 57 %

## 2024-05-10 LAB — CBC
HCT: 49.1 % (ref 39.0–52.0)
Hemoglobin: 16.8 g/dL (ref 13.0–17.0)
MCH: 27.8 pg (ref 26.0–34.0)
MCHC: 34.2 g/dL (ref 30.0–36.0)
MCV: 81.3 fL (ref 80.0–100.0)
Platelets: 312 10*3/uL (ref 150–400)
RBC: 6.04 MIL/uL — ABNORMAL HIGH (ref 4.22–5.81)
RDW: 13.1 % (ref 11.5–15.5)
WBC: 9.6 10*3/uL (ref 4.0–10.5)
nRBC: 0 % (ref 0.0–0.2)

## 2024-05-10 LAB — APTT: aPTT: 28 s (ref 24–36)

## 2024-05-10 LAB — CBG MONITORING, ED: Glucose-Capillary: 115 mg/dL — ABNORMAL HIGH (ref 70–99)

## 2024-05-10 MED ORDER — METOCLOPRAMIDE HCL 5 MG/ML IJ SOLN
10.0000 mg | Freq: Once | INTRAMUSCULAR | Status: AC
  Administered 2024-05-10: 10 mg via INTRAVENOUS
  Filled 2024-05-10: qty 2

## 2024-05-10 MED ORDER — ACETAMINOPHEN 500 MG PO TABS
1000.0000 mg | ORAL_TABLET | Freq: Once | ORAL | Status: AC
  Administered 2024-05-10: 1000 mg via ORAL
  Filled 2024-05-10: qty 2

## 2024-05-10 MED ORDER — DIPHENHYDRAMINE HCL 50 MG/ML IJ SOLN
12.5000 mg | Freq: Once | INTRAMUSCULAR | Status: AC
  Administered 2024-05-10: 12.5 mg via INTRAVENOUS
  Filled 2024-05-10: qty 1

## 2024-05-10 MED ORDER — LORAZEPAM 2 MG/ML IJ SOLN
1.0000 mg | INTRAMUSCULAR | Status: AC | PRN
  Administered 2024-05-10: 1 mg via INTRAVENOUS
  Filled 2024-05-10: qty 1

## 2024-05-10 MED ORDER — DEXAMETHASONE SODIUM PHOSPHATE 10 MG/ML IJ SOLN
10.0000 mg | Freq: Once | INTRAMUSCULAR | Status: AC
  Administered 2024-05-10: 10 mg via INTRAVENOUS
  Filled 2024-05-10: qty 1

## 2024-05-10 MED ORDER — HYDROXYZINE HCL 25 MG PO TABS
25.0000 mg | ORAL_TABLET | Freq: Once | ORAL | Status: AC
  Administered 2024-05-10: 25 mg via ORAL
  Filled 2024-05-10: qty 1

## 2024-05-10 MED ORDER — SODIUM CHLORIDE 0.9% FLUSH
3.0000 mL | Freq: Once | INTRAVENOUS | Status: AC
  Administered 2024-05-10: 3 mL via INTRAVENOUS

## 2024-05-10 MED ORDER — SODIUM CHLORIDE 0.9 % IV SOLN: INTRAVENOUS | Status: DC | PRN

## 2024-05-10 NOTE — ED Notes (Signed)
 Patient transported to CT

## 2024-05-10 NOTE — Consult Note (Signed)
 [facility]   Patient Name:   Jeffrey Richardson, Jeffrey Richardson Date of Birth:   [birth year] Identification Number:   MRN - [id number] Date of Service:   05/10/2024 20:47:14  Diagnosis:       R20.2 - Paresthesia of skin       H53.8 - Blurred Vision  Impression:      28 year old man with a history of anxiety, ADHD, asthma who presents with Speech and memory changes, left-sided weakness in the arm and leg and numbness in the left face ,arm and leg, left eye monocular blurry vision. . Symptoms in the setting of 3 weeks of headache and acute stressor. Cluster of symptoms is not consistent with a cerebrovascular localization and acute stroke is not suspected. Did not recommend IV thrombolytic in this instance. In the setting of headaches, may consider migraines though no other associated signs and symptoms. May have developed some component of medication overuse headache. Other consideration for him would be if functional neurologic disorder, but would need to assess for other structural processes that can cause focal neurologic symptoms    Our recommendations are outlined below.  Recommendations:        Stroke/Telemetry Floor       Neuro Checks (Q2)       Bedside Swallow Eval       DVT Prophylaxis       IV Fluids, Normal Saline       Head of Bed 30 Degrees       Euglycemia and Avoid Hyperthermia (PRN Acetaminophen )       Symptomatic management of his headache.       Consider brain MRI with and without contrast to rule out structural causes of his symptoms.    ------------------------------------------------------------------------------  Advanced Imaging: Advanced Imaging Deferred because:  Stroke not suspected with clinical presentation and exam   Metrics: Last Known Well: 05/10/2024 18:30:00 Dispatch Time: 05/10/2024 20:47:14 Arrival Time: 05/10/2024 20:15:00 Initial Response Time: 05/10/2024 20:56:12 Symptoms: Speech changes, left-sided  weakness or numbness, left eye blurry vision. Initial patient interaction: 05/10/2024 20:58:26 NIHSS Assessment Completed: 05/10/2024 21:08:43 Patient is not a candidate for Thrombolytic. Thrombolytic Medical Decision: 05/10/2024 21:14:45 Patient was not deemed candidate for Thrombolytic because of following reasons: other diagnosis suspected Clusters symptoms is not consistent with a acute intracranial process.  CT Head: I personally reviewed all the CT images that were available to me and it showed: No evidence of acute intracranial process.  Primary Provider Notified of Diagnostic Impression and Management Plan on: 05/10/2024 21:38:33    ------------------------------------------------------------------------------  History of Present Illness: Patient is a 28 year old Male.  Patient was brought by private transportation with symptoms of Speech changes, left-sided weakness or numbness, left eye blurry vision. He presents with cluster symptoms started about 630 when he was on phone with a friend. Had told a friend something, and 5 minutes later could not recall and he had said this. Also noted that he was sounding dazed and his speech was slowed. Had someone come to pick him up, and as he was driving he felt like he could see double briefly. In the car noted onset of left-sided numbness or weakness. Felt his gait was unsteady. On ambulating states she feels stiffness in his leg and has to put pressure on the right leg  Recent history notable for headache. For the past 3 weeks. Bitemporal and not associated with an acute exacerbation today.  Was discussing his ex-girlfriend on the phone during time of symptom onset. Had  previously presented with left leg pain and numbness in April 2025. Also notes stressor at that time.  With this headache has been taking 2 Aleve a day    Past Medical History:      There is no history of Stroke      There is no history of Migraine Headaches Other  PMH:  Anxiety, AD, Asthma  Medications:  No Anticoagulant use  No Antiplatelet use Reviewed EMR for current medications Other Medications Pertinent To Assessment Include: Aleve  Allergies:  Reviewed  Social History: Smoking: No Alcohol Use: No Drug Use: No  Family History:  There Is Family History [id number] family history, she was adopted There is no family history of premature cerebrovascular disease pertinent to this consultation  ROS : 14 Points Review of Systems was performed and was negative except mentioned in HPI.  Past Surgical History: There Is No Surgical History Contributory To Today's Visit     Examination: BP(149/109), Pulse(134), 1A: Level of Consciousness - Alert; keenly responsive + 0 1B: Ask Month and Age - Both Questions Right + 0 1C: Blink Eyes & Squeeze Hands - Performs Both Tasks + 0 2: Test Horizontal Extraocular Movements - Normal + 0 3: Test Visual Fields - No Visual Loss + 0 4: Test Facial Palsy (Use Grimace if Obtunded) - Normal symmetry + 0 5A: Test Left Arm Motor Drift - Drift, but doesn't hit bed + 1 5B: Test Right Arm Motor Drift - No Drift for 10 Seconds + 0 6A: Test Left Leg Motor Drift - Drift, but doesn't hit bed + 1 6B: Test Right Leg Motor Drift - No Drift for 5 Seconds + 0 7: Test Limb Ataxia (FNF/Heel-Shin) - No Ataxia + 0 8: Test Sensation - Mild-Moderate Loss: Less Sharp/More Dull + 1 9: Test Language/Aphasia - Normal; No aphasia + 0 10: Test Dysarthria - Normal + 0 11: Test Extinction/Inattention - No abnormality + 0  NIHSS Score: 3  NIHSS Free Text : Visual field full with the right eye. Left eye decreased with the left upper left lower and right upper feels. Left upper extremity drifts but does not pronate. Able to ambulate without support. No significant limping though feels like he was putting weight on the other leg. Diminished sensation to light touch on the left side  Pre-Morbid Modified Rankin Scale: 0 Points = No  symptoms at all  Spoke with : Dr Adrain Alar  This consult was conducted in real time using interactive audio and Immunologist. Patient was informed of the technology being used for this visit and agreed to proceed. Patient located in hospital and provider located at home/office setting.   Patient is being evaluated for possible acute neurologic impairment and high probability of imminent or life-threatening deterioration. I spent total of 35 minutes providing care to this patient, including time for face to face visit via telemedicine, review of medical records, imaging studies and discussion of findings with providers, the patient and/or family.   Dr Sylvester Evert   [facility] For Inpatient follow-up with [facility] physician please call [facility] at [phone]. As we are not an outpatient service for any post hospital discharge needs please contact the hospital for assistance. If you have any questions for the [facility] physicians or need to reconsult for clinical or diagnostic changes please contact us  via [facility] at [phone].

## 2024-05-10 NOTE — ED Notes (Signed)
 Pt arrived from [facility] in need of MRI

## 2024-05-10 NOTE — ED Notes (Signed)
ED Provider in triage.  

## 2024-05-10 NOTE — ED Provider Notes (Signed)
 [place] EMERGENCY DEPARTMENT AT [facility] Provider Note   CSN: [id number] Arrival date & time: 05/10/24  2015     History  Chief Complaint  Patient presents with   Headache    FAROUK VIVERO is a 28 y.o. male.  HPI   28 year old male with medical history significant for anxiety, ADHD, asthma presenting to the emergency department with multiple complaints.  The patient states that he has had a headache for the last 3 to 4 weeks.  He has had some additional left sided leg numbness during that same amount of time.  Today, he had acute onset numbness and tingling in the left leg and left arm with associated lightheadedness, had difficulty speaking and felt like he was having lapses in his memory today.  He has been under increased stress lately.  He does feel like he was having difficulty getting words out earlier this evening.  Symptoms came on acutely this evening at 1830.  No facial droop.  He endorses some blurry vision as well.  No full vision loss.  He states that he may have had intermittent double vision over the past month.  He continues to endorse a headache.  Home Medications Prior to Admission medications   Medication Sig Start Date End Date Taking? Authorizing Provider  acetaminophen  (TYLENOL ) 325 MG tablet Take 2 tablets (650 mg total) by mouth every 6 (six) hours. 10/22/19   Rober Chimera, MD  albuterol (PROVENTIL HFA) 108 (90 Base) MCG/ACT inhaler Inhale 2 puffs into the lungs every 6 (six) hours as needed for wheezing or shortness of breath (from allergy-induced asthma).     [provider]  dextroamphetamine  (DEXTROSTAT ) 5 MG tablet Take 1 tablet (5 mg total) by mouth 2 (two) times daily with breakfast and lunch. Patient not taking: Reported on 10/22/2019 05/21/16   Arfeen, Bronson Canny, MD  EPINEPHrine  0.3 mg/0.3 mL IJ SOAJ injection Inject 0.3 mg into the muscle once as needed for anaphylaxis.  04/04/16   [provider]  fluticasone (FLONASE)  50 MCG/ACT nasal spray Place 2 sprays into both nostrils daily as needed (for seasonal allergies).  02/07/15   [provider]  ibuprofen  (ADVIL ) 800 MG tablet Take 1 tablet (800 mg total) by mouth every 6 (six) hours as needed for moderate pain (pain score 4-6). 04/13/24   Ballard Bongo, MD  levocetirizine (XYZAL) 5 MG tablet Take 5 mg by mouth at bedtime.  04/03/15   [provider]  montelukast (SINGULAIR) 10 MG tablet Take 10 mg by mouth at bedtime.  04/03/15   [provider]  oxyCODONE  (ROXICODONE ) 5 MG immediate release tablet Take 1 tablet (5 mg total) by mouth every 6 (six) hours as needed (severe postop pain). 10/22/19   Rober Chimera, MD      Allergies    Patient has no known allergies.    Review of Systems   Review of Systems  All other systems reviewed and are negative.   Physical Exam Updated Vital Signs BP (!) 129/91   Pulse 100   Temp 98.4 F (36.9 C) (Oral)   Resp 20   SpO2 99%  Physical Exam Vitals and nursing note reviewed.  Constitutional:      General: He is not in acute distress.    Appearance: He is well-developed.     Comments: Anxious  HENT:     Head: Normocephalic and atraumatic.  Eyes:     Conjunctiva/sclera: Conjunctivae normal.  Cardiovascular:     Rate  and Rhythm: Normal rate and regular rhythm.     Heart sounds: No murmur heard. Pulmonary:     Effort: Pulmonary effort is normal. No respiratory distress.     Breath sounds: Normal breath sounds.  Abdominal:     Palpations: Abdomen is soft.     Tenderness: There is no abdominal tenderness.  Musculoskeletal:        General: No swelling.     Cervical back: Neck supple.  Skin:    General: Skin is warm and dry.     Capillary Refill: Capillary refill takes less than 2 seconds.  Neurological:     Mental Status: He is alert.     GCS: GCS eye subscore is 4. GCS verbal subscore is 5. GCS motor subscore is 6.     Comments: MENTAL STATUS EXAM:    Orientation: Alert  and oriented to person, place and time.  Memory: Cooperative, follows commands well.  Language: Pt speech is slowed, appears to be struggling to formulate occasional words  CRANIAL NERVES:    CN 2 (Optic): Visual fields intact to confrontation.  CN 3,4,6 (EOM): Pupils equal and reactive to light. Full extraocular eye movement without nystagmus.  CN 5 (Trigeminal): Facial sensation is DIMINISHED ON THE LEFT, no weakness of masticatory muscles.  CN 7 (Facial): No facial weakness or asymmetry.  CN 8 (Auditory): Auditory acuity grossly normal.  CN 9,10 (Glossophar): The uvula is midline, the palate elevates symmetrically.  CN 11 (spinal access): Normal sternocleidomastoid and trapezius strength.  CN 12 (Hypoglossal): The tongue is midline. No atrophy or fasciculations.Aaron Aas   MOTOR:  Muscle Strength: 5/5RUE, 5/5LUE, 5/5RLE, 5/5LLE.   COORDINATION:   No tremor.   SENSATION:   Intact to light touch all four extremities, DIMINISHED TO LIGHT TOUCH IN THE LEFT HEMIBODY.  GAIT: Gait normal without ataxia   Psychiatric:        Mood and Affect: Mood normal.     ED Results / Procedures / Treatments   Labs (all labs ordered are listed, but only abnormal results are displayed) Labs Reviewed  CBC - Abnormal; Notable for the following components:      Result Value   RBC 6.04 (*)    All other components within normal limits  COMPREHENSIVE METABOLIC PANEL WITH GFR - Abnormal; Notable for the following components:   Glucose, Bld 109 (*)    All other components within normal limits  CBG MONITORING, ED - Abnormal; Notable for the following components:   Glucose-Capillary 115 (*)    All other components within normal limits  PROTIME-INR  APTT  DIFFERENTIAL  ETHANOL    EKG None  Radiology No results found.  Procedures Procedures    Medications Ordered in ED Medications  0.9 %  sodium chloride  infusion ( Intravenous New Bag/Given 05/10/24 2125)  LORazepam  (ATIVAN ) injection 1 mg (has no  administration in time range)  sodium chloride  flush (NS) 0.9 % injection 3 mL (3 mLs Intravenous Given 05/10/24 2131)  acetaminophen  (TYLENOL ) tablet 1,000 mg (1,000 mg Oral Given 05/10/24 2129)  metoCLOPramide  (REGLAN ) injection 10 mg (10 mg Intravenous Given 05/10/24 2124)  diphenhydrAMINE  (BENADRYL ) injection 12.5 mg (12.5 mg Intravenous Given 05/10/24 2124)  dexamethasone  (DECADRON ) injection 10 mg (10 mg Intravenous Given 05/10/24 2123)  hydrOXYzine  (ATARAX ) tablet 25 mg (25 mg Oral Given 05/10/24 2210)    ED Course/ Medical Decision Making/ A&P  Medical Decision Making Amount and/or Complexity of Data Reviewed Labs: ordered. Radiology: ordered.  Risk OTC drugs. Prescription drug management.    28 year old male with medical history significant for anxiety, ADHD, asthma presenting to the emergency department with multiple complaints.  The patient states that he has had a headache for the last 3 to 4 weeks.  He has had some additional left sided leg numbness during that same amount of time.  Today, he had acute onset numbness and tingling in the left leg and left arm with associated lightheadedness, had difficulty speaking and felt like he was having lapses in his memory today.  He has been under increased stress lately.  He does feel like he was having difficulty getting words out earlier this evening.  Symptoms came on acutely this evening at 1830.  No facial droop.  He endorses some blurry vision as well.  No full vision loss.  He states that he may have had intermittent double vision over the past month.  He continues to endorse a headache.  On arrival, the patient was tachycardic currently 134, appeared to be having a slight panic attack in triage however this resolved rapidly with verbal de-escalation, code stroke called from triage due to patient consolation of symptoms and acute onset at 1830 today although some of his symptoms appear to be more vague and  subacute.  Considered intracranial mass, intracranial hemorrhage, ischemic CVA, complex migraine, functional neurologic disorder.  CBG in triage was 115 and the patient was taken to the CT scanner for code stroke imaging.  I was called by radiology and informed that the CT imaging was negative.  Remainder of laboratory evaluation was overall reassuring and the patient was administered migraine cocktail as well as Atarax  for some anxiety.  Discussed the care of the patient with Dr. 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  If negative likely plan for discharge with outpatient neurology follow-up.  Dr. Nolia Baumgartner was contacted for ER to ER transfer and the patient was emergently transferred by [facility] due to him receiving IV sedating medications for MRI imaging.  Patient stable at time of transfer.   Final Clinical Impression(s) / ED Diagnoses Final diagnoses:  Stroke-like symptoms  Anxiety  Panic attack  Nonintractable headache, unspecified chronicity pattern, unspecified headache type    Rx / DC Orders ED Discharge Orders     None         Rosealee Concha, MD 05/10/24 2246

## 2024-05-10 NOTE — Progress Notes (Signed)
 Code stroke activated at 2045. Neuro paged and patient to CT at 2047.  Patient returned from CT at 2054. Dr. Nathanel Bal connected at 2056.  MRS 0. No TNK.

## 2024-05-10 NOTE — ED Triage Notes (Signed)
 Patient reports headache for 3 weeks. Today at 1830 patient began having slurred speech. Numbness tingling on the left leg and memory lapse. Patient states he had a numbness and dizziness 1 month ago aswell. LKW he states was a month and a half ago. Patient states he has had a stressful 2 months. Patient was able to ambulate to the triage room, no slurred speech in triage.

## 2024-05-11 MED ORDER — PROPOFOL 10 MG/ML IV BOLUS: 0.5000 mg/kg | Freq: Once | INTRAVENOUS | Status: DC

## 2024-05-11 NOTE — ED Provider Notes (Signed)
 Patient transferred from [facility] emergency department for MRI.  Patient with constellation of neurologic symptoms of unclear etiology.  Patient has undergone MRI which does not show any acute stroke or other pathology.  Discussed with Dr. Murvin Arthurs, on-call for neurology.  He confirms that the patient does not require hospitalization for further workup.  Patient can be discharged for outpatient neurology follow-up.   Ballard Bongo, MD 05/11/24 [phone]

## 2024-07-22 ENCOUNTER — Telehealth: Payer: Self-pay | Admitting: Neurology

## 2024-07-22 NOTE — Telephone Encounter (Signed)
 Patient reschedule appointment due to work schedule conflict. Could get that day off work

## 2024-07-29 ENCOUNTER — Ambulatory Visit: Payer: Self-pay | Admitting: Neurology

## 2024-08-29 ENCOUNTER — Emergency Department (HOSPITAL_BASED_OUTPATIENT_CLINIC_OR_DEPARTMENT_OTHER)
Admission: EM | Admit: 2024-08-29 | Discharge: 2024-08-30 | Disposition: A | Discharge status: Home / Self Care | Attending: Emergency Medicine | Admitting: Emergency Medicine

## 2024-08-29 ENCOUNTER — Encounter (HOSPITAL_BASED_OUTPATIENT_CLINIC_OR_DEPARTMENT_OTHER): Payer: Self-pay

## 2024-08-29 ENCOUNTER — Other Ambulatory Visit: Payer: Self-pay

## 2024-08-29 DIAGNOSIS — R040 Epistaxis: Secondary | ICD-10-CM | POA: Diagnosis not present

## 2024-08-29 DIAGNOSIS — Z79899 Other long term (current) drug therapy: Secondary | ICD-10-CM | POA: Diagnosis not present

## 2024-08-29 DIAGNOSIS — Y92410 Unspecified street and highway as the place of occurrence of the external cause: Secondary | ICD-10-CM | POA: Insufficient documentation

## 2024-08-29 DIAGNOSIS — S0033XA Contusion of nose, initial encounter: Secondary | ICD-10-CM | POA: Insufficient documentation

## 2024-08-29 DIAGNOSIS — S0992XA Unspecified injury of nose, initial encounter: Secondary | ICD-10-CM | POA: Diagnosis present

## 2024-08-29 NOTE — ED Triage Notes (Signed)
 Left sided nose bleeding that began approx 1 hour prior to arrival.   Bleeding subsided since arrival.

## 2024-08-30 ENCOUNTER — Emergency Department (HOSPITAL_BASED_OUTPATIENT_CLINIC_OR_DEPARTMENT_OTHER)
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
## 2024-10-28 ENCOUNTER — Encounter: Payer: Self-pay | Admitting: Neurology

## 2024-10-28 ENCOUNTER — Ambulatory Visit: Payer: Self-pay | Admitting: Neurology

## 2024-10-28 VITALS — BP 143/84 | HR 85 | Ht 67.0 in | Wt 154.0 lb

## 2024-10-28 DIAGNOSIS — R531 Weakness: Secondary | ICD-10-CM

## 2024-10-28 NOTE — Patient Instructions (Signed)
 Continue with current medications Continue follow-up with your psychiatrist Follow-up care with primary care doctor Return as needed

## 2024-10-28 NOTE — Progress Notes (Signed)
 [facility]  PATIENT: Jeffrey Richardson DOB: November 22, 1996  REQUESTING CLINICIAN: Haze Lonni PARAS,* HISTORY FROM: Patient and chart review  REASON FOR VISIT: Left side numbness    HISTORICAL  CHIEF COMPLAINT:  Chief Complaint  Patient presents with   New Patient (Initial Visit)    Pt in room 13. Girlfriend in room. internal ED referral for stroke like symptoms.    HISTORY OF PRESENT ILLNESS:  Discussed the use of AI scribe software for clinical note transcription with the patient, who gave verbal consent to proceed.  DEMARRIUS Richardson is a 28 year old male with panic disorder and anxiety who presents with episodes of left-sided numbness and weakness. He was referred following a ED visit in May yeah for evaluation of transient neurological episodes.  He has experienced episodes of complete numbness on the left side of his body, beginning in May, with four occurrences since, the last being in late August. Each episode lasts approximately 20-25 minutes, during which he is unable to use his left arm and experiences weakness. He also noted an episode of transient speech difficulty lasting about 30 seconds while on the phone.  He reports that MRI and other tests performed during his hospital visit did not show evidence of a stroke.  He has a history of panic disorder and anxiety, which often lead to hyperventilation and can trigger asthma exacerbations, frequently resulting in ER visits. He uses an inhaler to manage these episodes, which can sometimes prevent escalation if he calms down quickly. His fiance and mother help calm him during these episodes, reducing the need for ER visits.  He is currently on medication for asthma, panic disorder, anxiety, and ADHD, and was recently started on an antidepressant.  His stress levels were high around the time of the initial episodes but have since improved. During the last episode in August, his stress levels were  reported as fine. He reports that he has not experienced any episodes of numbness or weakness since August.  No recent falls.     OTHER MEDICAL CONDITIONS: Anxiety, depression,   REVIEW OF SYSTEMS: Full 14 system review of systems performed and negative with exception of: As noted in the HPI  ALLERGIES: No Known Allergies  HOME MEDICATIONS: Outpatient Medications Prior to Visit  Medication Sig Dispense Refill   albuterol (PROVENTIL HFA) 108 (90 Base) MCG/ACT inhaler Inhale 2 puffs into the lungs every 6 (six) hours as needed for wheezing or shortness of breath (from allergy-induced asthma).      dextroamphetamine  (DEXTROSTAT ) 5 MG tablet Take 1 tablet (5 mg total) by mouth 2 (two) times daily with breakfast and lunch. 60 tablet 0   EPINEPHrine  0.3 mg/0.3 mL IJ SOAJ injection Inject 0.3 mg into the muscle once as needed for anaphylaxis.      fluticasone (FLONASE) 50 MCG/ACT nasal spray Place 2 sprays into both nostrils daily as needed (for seasonal allergies).   4   ibuprofen  (ADVIL ) 800 MG tablet Take 1 tablet (800 mg total) by mouth every 6 (six) hours as needed for moderate pain (pain score 4-6). 20 tablet 0   levocetirizine (XYZAL) 5 MG tablet Take 5 mg by mouth at bedtime.   4   acetaminophen  (TYLENOL ) 325 MG tablet Take 2 tablets (650 mg total) by mouth every 6 (six) hours. (Patient not taking: Reported on 10/28/2024)     montelukast (SINGULAIR) 10 MG tablet Take 10 mg by mouth at bedtime.  (Patient not taking: Reported on 10/28/2024)  4  oxyCODONE  (ROXICODONE ) 5 MG immediate release tablet Take 1 tablet (5 mg total) by mouth every 6 (six) hours as needed (severe postop pain). (Patient not taking: Reported on 10/28/2024) 15 tablet 0   No facility-administered medications prior to visit.    PAST MEDICAL HISTORY: Past Medical History:  Diagnosis Date   ADHD (attention deficit hyperactivity disorder)    Anxiety    Depression    Facial contusion     PAST SURGICAL HISTORY: Past  Surgical History:  Procedure Laterality Date   OPEN REDUCTION INTERNAL FIXATION (ORIF) METACARPAL Right 10/22/2019   Procedure: PERCUNTANEOUS PINNING RIGHT FIFTH METACARPAL;  Surgeon: Sebastian Lenis, MD;  Location: [facility] OR;  Service: Orthopedics;  Laterality: Right;   TONSILLECTOMY     WISDOM TOOTH EXTRACTION      FAMILY HISTORY: Family History  Adopted: Yes  Problem Relation Age of Onset   Stroke Mother    Stroke Brother     SOCIAL HISTORY: Social History   Socioeconomic History   Marital status: Single    Spouse name: Not on file   Number of children: Not on file   Years of education: Not on file   Highest education level: Not on file  Occupational History   Not on file  Tobacco Use   Smoking status: Never   Smokeless tobacco: Never  Vaping Use   Vaping status: Never Used  Substance and Sexual Activity   Alcohol use: Not Currently   Drug use: No   Sexual activity: Never  Other Topics Concern   Not on file  Social History Narrative   Not on file   Social Drivers of Health   Financial Resource Strain: Not on file  Food Insecurity: Not on file  Transportation Needs: Not on file  Physical Activity: Not on file  Stress: Not on file  Social Connections: Not on file  Intimate Partner Violence: Not on file    PHYSICAL EXAM  GENERAL EXAM/CONSTITUTIONAL: Vitals:  Vitals:   10/28/24 0933  BP: (!) 143/84  Pulse: 85  Weight: 154 lb (69.9 kg)  Height: 5' 7 (1.702 m)   Body mass index is 24.12 kg/m. Wt Readings from Last 3 Encounters:  10/28/24 154 lb (69.9 kg)  08/29/24 155 lb (70.3 kg)  04/12/24 152 lb (68.9 kg)   Patient is in no distress; well developed, nourished and groomed; neck is supple  MUSCULOSKELETAL: Gait, strength, tone, movements noted in Neurologic exam below  NEUROLOGIC: MENTAL STATUS:      No data to display         awake, alert, oriented to person, place and time recent and remote memory intact normal attention and  concentration language fluent, comprehension intact, naming intact fund of knowledge appropriate  CRANIAL NERVE:  2nd, 3rd, 4th, 6th - Visual fields full to confrontation, extraocular muscles intact, no nystagmus 5th - facial sensation symmetric 7th - facial strength symmetric 8th - hearing intact 9th - palate elevates symmetrically, uvula midline 11th - shoulder shrug symmetric 12th - tongue protrusion midline  MOTOR:  normal bulk and tone, full strength in the BUE, BLE  SENSORY:  normal and symmetric to light touch  COORDINATION:  finger-nose-finger, fine finger movements normal  GAIT/STATION:  normal     DIAGNOSTIC DATA (LABS, IMAGING, TESTING) - I reviewed patient records, labs, notes, testing and imaging myself where available.  Lab Results  Component Value Date   WBC 9.6 05/10/2024   HGB 16.8 05/10/2024   HCT 49.1 05/10/2024   MCV 81.3 05/10/2024  PLT 312 05/10/2024      Component Value Date/Time   NA 141 05/10/2024 2123   K 3.7 05/10/2024 2123   CL 103 05/10/2024 2123   CO2 25 05/10/2024 2123   GLUCOSE 109 (H) 05/10/2024 2123   BUN 13 05/10/2024 2123   CREATININE 0.95 05/10/2024 2123   CALCIUM 10.2 05/10/2024 2123   PROT 7.3 05/10/2024 2123   ALBUMIN 4.8 05/10/2024 2123   AST 27 05/10/2024 2123   ALT 32 05/10/2024 2123   ALKPHOS 54 05/10/2024 2123   BILITOT 0.2 05/10/2024 2123   GFRNONAA >60 05/10/2024 2123   GFRAA >60 09/26/2015 1335   No results found for: CHOL, HDL, LDLCALC, LDLDIRECT, TRIG, CHOLHDL No results found for: YHAJ8R No results found for: VITAMINB12 Lab Results  Component Value Date   TSH 1.143 09/26/2015    MRI Brain 05/11/2024 Normal brain MRI. No acute intracranial abnormality identified.   CT Head 05/10/2024 1. Normal head CT.  No acute intracranial abnormality. 2. ASPECTS is 10.    ASSESSMENT AND PLAN  28 y.o. year old male with    Episodes of left-sided numbness and weakness Intermittent  episodes of left-sided numbness and weakness, lasting approximately 20 minutes, occurring four times since May, with the last episode in late August. MRI and other tests ruled out stroke. Symptoms may be stress-induced, though stress levels have been lower since the initial episodes. No recent falls reported. No additional testing required as initial tests were normal. - Advised to seek medical attention if episodes recur.  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 Inhaler use can prevent exacerbations if administered promptly. Approximately 80% of panic attacks result in ER visits due to asthma exacerbation. - Continue using inhaler as needed for asthma exacerbations.  Panic disorder and anxiety Managed by a psychiatrist. Recent initiation of an antidepressant approximately one month ago. Panic attacks primarily involve hyperventilation, which triggers asthma. Support from family members, particularly fiance and mother, helps manage panic attacks.    1. Weakness     Patient Instructions  Continue with current medications Continue follow-up with your psychiatrist Follow-up care with primary care doctor Return as needed  No orders of the defined types were placed in this encounter.   No orders of the defined types were placed in this encounter.   Return if symptoms worsen or fail to improve.    Pastor Falling, MD 10/28/2024, 10:26 AM  [facility] [address], [address] [place], [place] [address] [phone]
